# Patient Record
Sex: Female | Born: 1965 | Race: Black or African American | Hispanic: No | Marital: Single | Smoking: Former smoker
Health system: Southern US, Community
[De-identification: ages and names within clinical notes are randomized; demographics above are authoritative.]

## PROBLEM LIST (undated history)

## (undated) DIAGNOSIS — M1990 Unspecified osteoarthritis, unspecified site: Secondary | ICD-10-CM

## (undated) DIAGNOSIS — R480 Dyslexia and alexia: Secondary | ICD-10-CM

## (undated) DIAGNOSIS — I10 Essential (primary) hypertension: Secondary | ICD-10-CM

## (undated) DIAGNOSIS — M329 Systemic lupus erythematosus, unspecified: Secondary | ICD-10-CM

## (undated) DIAGNOSIS — N183 Chronic kidney disease, stage 3 unspecified: Secondary | ICD-10-CM

## (undated) DIAGNOSIS — IMO0002 Reserved for concepts with insufficient information to code with codable children: Secondary | ICD-10-CM

## (undated) HISTORY — DX: Dyslexia and alexia: R48.0

## (undated) HISTORY — PX: OOPHORECTOMY: SHX86

## (undated) HISTORY — DX: Chronic kidney disease, stage 3 unspecified: N18.30

---

## 2003-03-03 ENCOUNTER — Emergency Department (HOSPITAL_COMMUNITY): Admission: EM | Admit: 2003-03-03 | Discharge: 2003-03-03 | Payer: Self-pay | Admitting: Emergency Medicine

## 2003-03-24 ENCOUNTER — Inpatient Hospital Stay (HOSPITAL_COMMUNITY): Admission: AD | Admit: 2003-03-24 | Discharge: 2003-03-24 | Payer: Self-pay | Admitting: Family Medicine

## 2003-10-26 ENCOUNTER — Ambulatory Visit (HOSPITAL_COMMUNITY): Admission: RE | Admit: 2003-10-26 | Discharge: 2003-10-26 | Payer: Self-pay | Admitting: Unknown Physician Specialty

## 2004-03-05 ENCOUNTER — Emergency Department (HOSPITAL_COMMUNITY): Admission: EM | Admit: 2004-03-05 | Discharge: 2004-03-05 | Payer: Self-pay | Admitting: Emergency Medicine

## 2004-03-09 ENCOUNTER — Emergency Department (HOSPITAL_COMMUNITY): Admission: EM | Admit: 2004-03-09 | Discharge: 2004-03-10 | Payer: Self-pay | Admitting: Emergency Medicine

## 2004-12-12 ENCOUNTER — Emergency Department (HOSPITAL_COMMUNITY): Admission: EM | Admit: 2004-12-12 | Discharge: 2004-12-12 | Payer: Self-pay | Admitting: Family Medicine

## 2005-02-08 ENCOUNTER — Emergency Department (HOSPITAL_COMMUNITY): Admission: EM | Admit: 2005-02-08 | Discharge: 2005-02-09 | Payer: Self-pay | Admitting: Emergency Medicine

## 2005-05-28 ENCOUNTER — Emergency Department (HOSPITAL_COMMUNITY): Admission: EM | Admit: 2005-05-28 | Discharge: 2005-05-28 | Payer: Self-pay | Admitting: Emergency Medicine

## 2005-09-29 ENCOUNTER — Emergency Department (HOSPITAL_COMMUNITY): Admission: EM | Admit: 2005-09-29 | Discharge: 2005-09-29 | Payer: Self-pay | Admitting: Emergency Medicine

## 2005-10-22 ENCOUNTER — Emergency Department (HOSPITAL_COMMUNITY): Admission: EM | Admit: 2005-10-22 | Discharge: 2005-10-22 | Payer: Self-pay | Admitting: Emergency Medicine

## 2006-02-25 ENCOUNTER — Encounter: Admission: RE | Admit: 2006-02-25 | Discharge: 2006-02-25 | Payer: Self-pay | Admitting: Occupational Medicine

## 2006-03-26 ENCOUNTER — Other Ambulatory Visit: Admission: RE | Admit: 2006-03-26 | Discharge: 2006-03-26 | Payer: Self-pay | Admitting: Obstetrics and Gynecology

## 2006-04-06 ENCOUNTER — Ambulatory Visit (HOSPITAL_COMMUNITY): Admission: RE | Admit: 2006-04-06 | Discharge: 2006-04-06 | Payer: Self-pay | Admitting: Obstetrics and Gynecology

## 2007-12-08 ENCOUNTER — Inpatient Hospital Stay (HOSPITAL_COMMUNITY): Admission: AD | Admit: 2007-12-08 | Discharge: 2007-12-08 | Payer: Self-pay | Admitting: Obstetrics and Gynecology

## 2008-02-04 ENCOUNTER — Emergency Department (HOSPITAL_COMMUNITY): Admission: EM | Admit: 2008-02-04 | Discharge: 2008-02-04 | Payer: Self-pay | Admitting: Emergency Medicine

## 2008-02-24 ENCOUNTER — Emergency Department (HOSPITAL_COMMUNITY): Admission: EM | Admit: 2008-02-24 | Discharge: 2008-02-24 | Payer: Self-pay | Admitting: Emergency Medicine

## 2008-08-23 ENCOUNTER — Emergency Department (HOSPITAL_COMMUNITY): Admission: EM | Admit: 2008-08-23 | Discharge: 2008-08-23 | Payer: Self-pay | Admitting: Family Medicine

## 2009-02-09 ENCOUNTER — Emergency Department (HOSPITAL_COMMUNITY): Admission: EM | Admit: 2009-02-09 | Discharge: 2009-02-09 | Payer: Self-pay | Admitting: Family Medicine

## 2009-02-13 ENCOUNTER — Emergency Department (HOSPITAL_COMMUNITY): Admission: EM | Admit: 2009-02-13 | Discharge: 2009-02-13 | Payer: Self-pay | Admitting: Family Medicine

## 2009-04-02 ENCOUNTER — Emergency Department (HOSPITAL_COMMUNITY): Admission: EM | Admit: 2009-04-02 | Discharge: 2009-04-02 | Payer: Self-pay | Admitting: Emergency Medicine

## 2009-09-19 ENCOUNTER — Emergency Department (HOSPITAL_COMMUNITY): Admission: EM | Admit: 2009-09-19 | Discharge: 2009-09-19 | Payer: Self-pay | Admitting: Emergency Medicine

## 2010-01-05 ENCOUNTER — Emergency Department (HOSPITAL_COMMUNITY): Admission: EM | Admit: 2010-01-05 | Discharge: 2010-01-06 | Payer: Self-pay | Admitting: Emergency Medicine

## 2010-04-05 ENCOUNTER — Other Ambulatory Visit
Admission: RE | Admit: 2010-04-05 | Discharge: 2010-04-05 | Payer: Self-pay | Source: Home / Self Care | Admitting: Obstetrics and Gynecology

## 2010-04-05 ENCOUNTER — Encounter
Admission: RE | Admit: 2010-04-05 | Discharge: 2010-04-05 | Payer: Self-pay | Source: Home / Self Care | Attending: Internal Medicine | Admitting: Internal Medicine

## 2010-04-14 ENCOUNTER — Encounter: Payer: Self-pay | Admitting: Unknown Physician Specialty

## 2010-06-05 LAB — CBC
HCT: 40.8 % (ref 36.0–46.0)
Hemoglobin: 14.8 g/dL (ref 12.0–15.0)
MCH: 31.8 pg (ref 26.0–34.0)
MCHC: 36.3 g/dL — ABNORMAL HIGH (ref 30.0–36.0)
MCV: 87.6 fL (ref 78.0–100.0)
Platelets: 214 10*3/uL (ref 150–400)
RBC: 4.66 MIL/uL (ref 3.87–5.11)
RDW: 13.3 % (ref 11.5–15.5)
WBC: 8.8 10*3/uL (ref 4.0–10.5)

## 2010-06-05 LAB — POCT CARDIAC MARKERS
CKMB, poc: <1 ng/mL — ABNORMAL LOW (ref 1.0–8.0)
CKMB, poc: <1 ng/mL — ABNORMAL LOW (ref 1.0–8.0)
Myoglobin, poc: 50.6 ng/mL (ref 12–200)
Myoglobin, poc: 52.2 ng/mL (ref 12–200)
Troponin i, poc: <0.05 ng/mL (ref 0.00–0.09)
Troponin i, poc: <0.05 ng/mL (ref 0.00–0.09)

## 2010-06-05 LAB — DIFFERENTIAL
Basophils Absolute: 0 10*3/uL (ref 0.0–0.1)
Basophils Relative: 0 % (ref 0–1)
Eosinophils Absolute: 0.2 10*3/uL (ref 0.0–0.7)
Eosinophils Relative: 2 % (ref 0–5)
Lymphocytes Relative: 50 % — ABNORMAL HIGH (ref 12–46)
Lymphs Abs: 4.4 10*3/uL — ABNORMAL HIGH (ref 0.7–4.0)
Monocytes Absolute: 0.5 10*3/uL (ref 0.1–1.0)
Monocytes Relative: 6 % (ref 3–12)
Neutro Abs: 3.6 10*3/uL (ref 1.7–7.7)
Neutrophils Relative %: 41 % — ABNORMAL LOW (ref 43–77)

## 2010-06-05 LAB — POCT I-STAT, CHEM 8
BUN: 16 mg/dL (ref 6–23)
Calcium, Ion: 1.13 mmol/L (ref 1.12–1.32)
Chloride: 98 mEq/L (ref 96–112)
Creatinine, Ser: 0.9 mg/dL (ref 0.4–1.2)
Glucose, Bld: 374 mg/dL — ABNORMAL HIGH (ref 70–99)
HCT: 45 % (ref 36.0–46.0)
Hemoglobin: 15.3 g/dL — ABNORMAL HIGH (ref 12.0–15.0)
Potassium: 4 mEq/L (ref 3.5–5.1)
Sodium: 131 mEq/L — ABNORMAL LOW (ref 135–145)
TCO2: 25 mmol/L (ref 0–100)

## 2010-06-09 LAB — RAPID STREP SCREEN (MED CTR MEBANE ONLY): Streptococcus, Group A Screen (Direct): NEGATIVE

## 2010-07-01 LAB — GLUCOSE, CAPILLARY: Glucose-Capillary: 204 mg/dL — ABNORMAL HIGH (ref 70–99)

## 2010-12-11 ENCOUNTER — Emergency Department (HOSPITAL_COMMUNITY): Payer: 59

## 2010-12-11 ENCOUNTER — Emergency Department (HOSPITAL_COMMUNITY)
Admission: EM | Admit: 2010-12-11 | Discharge: 2010-12-11 | Disposition: A | Discharge status: Home / Self Care | Payer: 59 | Attending: Emergency Medicine | Admitting: Emergency Medicine

## 2010-12-11 DIAGNOSIS — J45909 Unspecified asthma, uncomplicated: Secondary | ICD-10-CM | POA: Insufficient documentation

## 2010-12-11 DIAGNOSIS — I10 Essential (primary) hypertension: Secondary | ICD-10-CM | POA: Insufficient documentation

## 2010-12-11 DIAGNOSIS — Z79899 Other long term (current) drug therapy: Secondary | ICD-10-CM | POA: Insufficient documentation

## 2010-12-11 DIAGNOSIS — E119 Type 2 diabetes mellitus without complications: Secondary | ICD-10-CM | POA: Insufficient documentation

## 2010-12-11 DIAGNOSIS — F17200 Nicotine dependence, unspecified, uncomplicated: Secondary | ICD-10-CM | POA: Insufficient documentation

## 2010-12-11 DIAGNOSIS — R0789 Other chest pain: Secondary | ICD-10-CM | POA: Insufficient documentation

## 2010-12-11 LAB — CBC
HCT: 38.9 % (ref 36.0–46.0)
Hemoglobin: 13.9 g/dL (ref 12.0–15.0)
MCH: 31.3 pg (ref 26.0–34.0)
MCHC: 35.7 g/dL (ref 30.0–36.0)
MCV: 87.6 fL (ref 78.0–100.0)
Platelets: 239 10*3/uL (ref 150–400)
RDW: 12.9 % (ref 11.5–15.5)
WBC: 9.3 10*3/uL (ref 4.0–10.5)

## 2010-12-11 LAB — BASIC METABOLIC PANEL
BUN: 19 mg/dL (ref 6–23)
CO2: 29 mEq/L (ref 19–32)
Calcium: 9.9 mg/dL (ref 8.4–10.5)
Creatinine, Ser: 1.22 mg/dL — ABNORMAL HIGH (ref 0.50–1.10)
GFR calc Af Amer: 57 mL/min — ABNORMAL LOW (ref >60)
GFR calc non Af Amer: 47 mL/min — ABNORMAL LOW (ref >60)
Glucose, Bld: 214 mg/dL — ABNORMAL HIGH (ref 70–99)
Potassium: 3.8 mEq/L (ref 3.5–5.1)

## 2010-12-11 LAB — POCT I-STAT TROPONIN I

## 2010-12-11 LAB — DIFFERENTIAL
Basophils Absolute: 0 10*3/uL (ref 0.0–0.1)
Eosinophils Absolute: 0.4 10*3/uL (ref 0.0–0.7)
Eosinophils Relative: 4 % (ref 0–5)
Lymphocytes Relative: 48 % — ABNORMAL HIGH (ref 12–46)
Lymphs Abs: 4.5 10*3/uL — ABNORMAL HIGH (ref 0.7–4.0)
Monocytes Absolute: 0.4 10*3/uL (ref 0.1–1.0)
Monocytes Relative: 5 % (ref 3–12)
Neutro Abs: 4.1 10*3/uL (ref 1.7–7.7)
Neutrophils Relative %: 44 % (ref 43–77)

## 2010-12-23 LAB — COMPREHENSIVE METABOLIC PANEL
AST: 23
BUN: 12
CO2: 26
Chloride: 101
Creatinine, Ser: 1.47 — ABNORMAL HIGH
GFR calc non Af Amer: 39 — ABNORMAL LOW
Glucose, Bld: 204 — ABNORMAL HIGH
Total Bilirubin: 0.2 — ABNORMAL LOW

## 2010-12-23 LAB — URINALYSIS, ROUTINE W REFLEX MICROSCOPIC
Glucose, UA: 250 — AB
Ketones, ur: NEGATIVE
Protein, ur: NEGATIVE

## 2010-12-23 LAB — CBC
HCT: 39.8
Hemoglobin: 13.7
MCV: 93.6
RBC: 4.25
WBC: 7.6

## 2010-12-23 LAB — WET PREP, GENITAL

## 2010-12-23 LAB — GLUCOSE, CAPILLARY: Glucose-Capillary: 181 — ABNORMAL HIGH

## 2011-01-20 ENCOUNTER — Emergency Department (HOSPITAL_COMMUNITY): Payer: 59

## 2011-01-20 ENCOUNTER — Emergency Department (HOSPITAL_COMMUNITY)
Admission: EM | Admit: 2011-01-20 | Discharge: 2011-01-20 | Disposition: A | Discharge status: Home / Self Care | Payer: 59 | Attending: Emergency Medicine | Admitting: Emergency Medicine

## 2011-01-20 DIAGNOSIS — R079 Chest pain, unspecified: Secondary | ICD-10-CM | POA: Insufficient documentation

## 2011-01-20 DIAGNOSIS — L299 Pruritus, unspecified: Secondary | ICD-10-CM | POA: Insufficient documentation

## 2011-01-20 DIAGNOSIS — IMO0001 Reserved for inherently not codable concepts without codable children: Secondary | ICD-10-CM | POA: Insufficient documentation

## 2011-01-20 DIAGNOSIS — J45909 Unspecified asthma, uncomplicated: Secondary | ICD-10-CM | POA: Insufficient documentation

## 2011-01-20 DIAGNOSIS — R21 Rash and other nonspecific skin eruption: Secondary | ICD-10-CM | POA: Insufficient documentation

## 2011-01-20 DIAGNOSIS — Z79899 Other long term (current) drug therapy: Secondary | ICD-10-CM | POA: Insufficient documentation

## 2011-01-20 DIAGNOSIS — L723 Sebaceous cyst: Secondary | ICD-10-CM | POA: Insufficient documentation

## 2011-01-20 DIAGNOSIS — Z7982 Long term (current) use of aspirin: Secondary | ICD-10-CM | POA: Insufficient documentation

## 2011-01-20 DIAGNOSIS — E119 Type 2 diabetes mellitus without complications: Secondary | ICD-10-CM | POA: Insufficient documentation

## 2011-01-20 DIAGNOSIS — F17200 Nicotine dependence, unspecified, uncomplicated: Secondary | ICD-10-CM | POA: Insufficient documentation

## 2011-01-20 DIAGNOSIS — I10 Essential (primary) hypertension: Secondary | ICD-10-CM | POA: Insufficient documentation

## 2011-01-20 DIAGNOSIS — M79609 Pain in unspecified limb: Secondary | ICD-10-CM | POA: Insufficient documentation

## 2011-01-20 DIAGNOSIS — R509 Fever, unspecified: Secondary | ICD-10-CM | POA: Insufficient documentation

## 2011-01-20 LAB — GLUCOSE, CAPILLARY: Glucose-Capillary: 145 mg/dL — ABNORMAL HIGH (ref 70–99)

## 2011-01-27 HISTORY — PX: FOOT SURGERY: SHX648

## 2011-02-27 ENCOUNTER — Emergency Department (HOSPITAL_COMMUNITY)
Admission: EM | Admit: 2011-02-27 | Discharge: 2011-02-27 | Disposition: A | Discharge status: Home / Self Care | Payer: 59 | Attending: Emergency Medicine | Admitting: Emergency Medicine

## 2011-02-27 ENCOUNTER — Encounter: Payer: Self-pay | Admitting: *Deleted

## 2011-02-27 DIAGNOSIS — B86 Scabies: Secondary | ICD-10-CM | POA: Insufficient documentation

## 2011-02-27 DIAGNOSIS — Z79899 Other long term (current) drug therapy: Secondary | ICD-10-CM | POA: Insufficient documentation

## 2011-02-27 DIAGNOSIS — L298 Other pruritus: Secondary | ICD-10-CM | POA: Insufficient documentation

## 2011-02-27 DIAGNOSIS — L2989 Other pruritus: Secondary | ICD-10-CM | POA: Insufficient documentation

## 2011-02-27 DIAGNOSIS — I10 Essential (primary) hypertension: Secondary | ICD-10-CM | POA: Insufficient documentation

## 2011-02-27 DIAGNOSIS — M129 Arthropathy, unspecified: Secondary | ICD-10-CM | POA: Insufficient documentation

## 2011-02-27 DIAGNOSIS — E119 Type 2 diabetes mellitus without complications: Secondary | ICD-10-CM | POA: Insufficient documentation

## 2011-02-27 DIAGNOSIS — R21 Rash and other nonspecific skin eruption: Secondary | ICD-10-CM | POA: Insufficient documentation

## 2011-02-27 DIAGNOSIS — M2550 Pain in unspecified joint: Secondary | ICD-10-CM | POA: Insufficient documentation

## 2011-02-27 DIAGNOSIS — Z7982 Long term (current) use of aspirin: Secondary | ICD-10-CM | POA: Insufficient documentation

## 2011-02-27 DIAGNOSIS — R269 Unspecified abnormalities of gait and mobility: Secondary | ICD-10-CM | POA: Insufficient documentation

## 2011-02-27 DIAGNOSIS — M7989 Other specified soft tissue disorders: Secondary | ICD-10-CM | POA: Insufficient documentation

## 2011-02-27 DIAGNOSIS — G8918 Other acute postprocedural pain: Secondary | ICD-10-CM | POA: Insufficient documentation

## 2011-02-27 HISTORY — DX: Essential (primary) hypertension: I10

## 2011-02-27 HISTORY — DX: Unspecified osteoarthritis, unspecified site: M19.90

## 2011-02-27 MED ORDER — HYDROCODONE-ACETAMINOPHEN 5-325 MG PO TABS: 1.0000 | ORAL_TABLET | Freq: Once | ORAL | Status: AC

## 2011-02-27 MED ORDER — PERMETHRIN 5 % EX CREA: TOPICAL_CREAM | CUTANEOUS | Status: AC

## 2011-02-27 MED ORDER — HYDROCODONE-ACETAMINOPHEN 5-325 MG PO TABS
1.0000 | ORAL_TABLET | Freq: Once | ORAL | Status: AC
  Administered 2011-02-27: 1 via ORAL
  Filled 2011-02-27: qty 1

## 2011-02-27 NOTE — ED Notes (Signed)
Pt reports she has had rash over entire body x 1 month. Small petechiae noted over entire body. Pt states she had been checked out for it before but it has not went away.

## 2011-02-27 NOTE — ED Notes (Signed)
Pt c/o mild nausea r/t pain medication.  Sprite given along with crackers.  Pt given rx and d/c instructions with verbal understanding expressed.

## 2011-02-27 NOTE — ED Provider Notes (Signed)
Medical screening examination/treatment/procedure(s) were performed by non-physician practitioner and as supervising physician I was immediately available for consultation/collaboration.   Benny Lennert, MD 02/27/11 2026

## 2011-02-27 NOTE — ED Provider Notes (Signed)
History     CSN: [id number] Arrival date & time: 02/27/2011  4:57 PM   First MD Initiated Contact with Patient 02/27/11 2003      Chief Complaint  Patient presents with  . 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  Patient is a 45 y.o. female presenting with rash. The history is provided by the patient. No language interpreter was used.  Rash  This is a chronic problem. The current episode started more than 1 week ago. The problem has been gradually worsening. The problem is associated with nothing. There has been no fever. The rash is present on the torso, back, abdomen, groin, left arm and right arm. The pain is at a severity of 4/10. The pain is moderate. The pain has been constant since onset. Associated symptoms include itching. She has tried antihistamines, antibiotic cream and steriods for the symptoms. The treatment provided no relief.    Past Medical History  Diagnosis Date  . Diabetes mellitus   . Hypertension   . Arthritis     History reviewed. No pertinent past surgical history.  History reviewed. No pertinent family history.  History  Substance Use Topics  . Smoking status: Current Everyday Smoker    Types: Cigarettes  . Smokeless tobacco: Not on file  . Alcohol Use: No    OB History    Grav Para Term Preterm Abortions TAB SAB Ect Mult Living                  Review of Systems  Constitutional: Negative for fever.  Musculoskeletal: Positive for arthralgias and gait problem.  Skin: Positive for itching and rash.  All other systems reviewed and are  negative.    Allergies  Penicillins  Home Medications   Current Outpatient Rx  Name Route Sig Dispense Refill  . VITAMIN C PO Oral Take 1 tablet by mouth daily.      . ASPIRIN EC 81 MG PO TBEC Oral Take 81 mg by mouth daily.      Marland Kitchen DIPHENHYDRAMINE HCL 25 MG PO CAPS Oral Take 25 mg by mouth at bedtime as needed. For rash      . OMEGA-3 FATTY ACIDS 1000 MG PO CAPS Oral Take 1 g by mouth daily.      Marland Kitchen GLIPIZIDE 10 MG PO TABS Oral Take 10 mg by mouth daily.      Marland Kitchen HYDROCORTISONE 1 % EX CREA Topical Apply 1 application topically 2 (two) times daily.      . IBUPROFEN 800 MG PO TABS Oral Take 800 mg by mouth 2 (two) times daily as needed. For pain     . LISINOPRIL 20 MG PO TABS Oral Take 20 mg by mouth daily.      Marland Kitchen METFORMIN HCL 1000 MG PO TABS Oral Take 1,000 mg by mouth 2 (two) times daily with a meal.      . NYQUIL PO Oral Take 30 mLs by mouth at bedtime as needed. For sleep     . SIMVASTATIN 10 MG PO TABS Oral Take 10 mg by mouth at bedtime.        BP 123/61  Pulse 73  Temp(Src) 97.7 F (36.5 C) (Oral)  Resp 20  SpO2 99%  Physical Exam  Nursing note and vitals reviewed. Constitutional: She is oriented to person, place, and time. She appears well-developed and well-nourished. No distress.  HENT:  Head: Normocephalic and atraumatic.  Right Ear: External ear normal.  Left Ear: External ear normal.  Eyes: Conjunctivae are normal. Pupils are equal, round, and reactive to light. No scleral icterus.  Neck: Normal range of motion. Neck supple.  Cardiovascular: Normal rate and normal heart sounds.   Pulmonary/Chest: Effort normal and breath sounds normal. She exhibits no tenderness.  Abdominal: Soft. Bowel sounds are normal.  Musculoskeletal:       Patient with dressing and post op shoe noted to right foot - mild edema noted, sutures removed, wound clean and healing - no drainage.  Neurological: She is alert and oriented to person, place, and time.  Skin: Rash noted.        Diffuse papular rash noted to trunk, arm, abdomen, back - several of the papules form a line and burrowing noted.  Psychiatric: She has a normal mood and affect. Her behavior is normal. Judgment and thought content normal.    ED Course  Procedures (including critical care time)  Labs Reviewed - No data to display No results found.   Post op foot pain scabies    MDM  Will refill pain medication, I believe this to be scabies.  Will treat as such        Scarlette Calico C. Dunlap, [place] 02/27/11 2014

## 2011-02-27 NOTE — ED Notes (Signed)
ambulatory at discharge.

## 2011-04-11 ENCOUNTER — Other Ambulatory Visit: Payer: Self-pay | Admitting: Internal Medicine

## 2011-04-11 DIAGNOSIS — Z1231 Encounter for screening mammogram for malignant neoplasm of breast: Secondary | ICD-10-CM

## 2011-04-25 ENCOUNTER — Ambulatory Visit
Admission: RE | Admit: 2011-04-25 | Discharge: 2011-04-25 | Disposition: A | Discharge status: Home / Self Care | Payer: 59 | Source: Ambulatory Visit | Attending: Internal Medicine | Admitting: Internal Medicine

## 2011-04-25 DIAGNOSIS — Z1231 Encounter for screening mammogram for malignant neoplasm of breast: Secondary | ICD-10-CM

## 2011-05-20 ENCOUNTER — Other Ambulatory Visit: Payer: Self-pay | Admitting: Obstetrics and Gynecology

## 2011-05-20 ENCOUNTER — Other Ambulatory Visit (HOSPITAL_COMMUNITY)
Admission: RE | Admit: 2011-05-20 | Discharge: 2011-05-20 | Disposition: A | Discharge status: Home / Self Care | Payer: 59 | Source: Ambulatory Visit | Attending: Obstetrics and Gynecology | Admitting: Obstetrics and Gynecology

## 2011-05-20 DIAGNOSIS — Z01419 Encounter for gynecological examination (general) (routine) without abnormal findings: Secondary | ICD-10-CM | POA: Insufficient documentation

## 2011-05-21 ENCOUNTER — Emergency Department (INDEPENDENT_AMBULATORY_CARE_PROVIDER_SITE_OTHER)
Admission: EM | Admit: 2011-05-21 | Discharge: 2011-05-21 | Disposition: A | Discharge status: Home Health | Payer: Worker's Compensation | Source: Home / Self Care

## 2011-05-21 ENCOUNTER — Encounter (HOSPITAL_COMMUNITY): Payer: Self-pay | Admitting: Cardiology

## 2011-05-21 DIAGNOSIS — T148XXA Other injury of unspecified body region, initial encounter: Secondary | ICD-10-CM

## 2011-05-21 MED ORDER — CYCLOBENZAPRINE HCL 5 MG PO TABS: 5.0000 mg | ORAL_TABLET | Freq: Three times a day (TID) | ORAL | Status: AC | PRN

## 2011-05-21 MED ORDER — KETOROLAC TROMETHAMINE 30 MG/ML IJ SOLN
INTRAMUSCULAR | Status: AC
  Filled 2011-05-21: qty 1

## 2011-05-21 MED ORDER — IBUPROFEN 800 MG PO TABS: 800.0000 mg | ORAL_TABLET | Freq: Three times a day (TID) | ORAL | Status: AC | PRN

## 2011-05-21 MED ORDER — IBUPROFEN 800 MG PO TABS
800.0000 mg | ORAL_TABLET | Freq: Once | ORAL | Status: AC
  Administered 2011-05-21: 800 mg via ORAL

## 2011-05-21 MED ORDER — IBUPROFEN 800 MG PO TABS
ORAL_TABLET | ORAL | Status: AC
  Filled 2011-05-21: qty 1

## 2011-05-21 MED ORDER — IBUPROFEN 800 MG PO TABS: 800.0000 mg | ORAL_TABLET | Freq: Once | ORAL | Status: DC

## 2011-05-21 MED ORDER — KETOROLAC TROMETHAMINE 30 MG/ML IJ SOLN: 30.0000 mg | Freq: Once | INTRAMUSCULAR | Status: DC

## 2011-05-21 NOTE — ED Provider Notes (Signed)
Courtney Payne is a 46 y.o. female who presents to Urgent Care today for acute shoulder and neck pain. patient states she was at work. 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 The patient experienced immediate, sharp pain in left shoulder and left trapezius muscle. Pain when she tries to abduct her arm or raise her hand above head. She does not have history of shoulder injury in the past. She denies any chest pain or shortness of breath during this period.   PMH reviewed.  ROS as above otherwise neg Medications reviewed. No current facility-administered medications for this encounter.   Current Outpatient Prescriptions  Medication Sig Dispense Refill  . Ascorbic Acid (VITAMIN C PO) Take 1 tablet by mouth daily.        Marland Kitchen aspirin EC 81 MG tablet Take 81 mg by mouth daily.        . diphenhydrAMINE (BENADRYL) 25 mg capsule Take 25 mg by mouth at bedtime as needed. For rash        . fish oil-omega-3 fatty acids 1000 MG capsule Take 1 g by mouth daily.        Marland Kitchen glipiZIDE (GLUCOTROL) 10 MG tablet Take 10 mg by mouth daily.        . hydrocortisone cream 1 % Apply 1 application topically 2 (two) times daily.        Marland Kitchen ibuprofen (ADVIL,MOTRIN) 800 MG tablet Take 800 mg by mouth 2 (two) times daily as needed. For pain       . lisinopril (PRINIVIL,ZESTRIL) 20 MG tablet Take 20 mg by mouth daily.        . metFORMIN (GLUCOPHAGE) 1000 MG tablet Take 1,000 mg by mouth 2 (two) times daily with a meal.        . Pseudoeph-Doxylamine-DM-APAP (NYQUIL PO) Take 30 mLs by mouth at bedtime as needed. For sleep       . simvastatin (ZOCOR) 10 MG tablet Take 10 mg by mouth at bedtime.          Exam:  BP 136/81  Pulse 80  Temp(Src) 98.6 F (37 C) (Oral)  Resp 20  SpO2 98% Gen: Well NAD HEENT: EOMI,  MMM Lungs: CTABL Nl WOB Heart: RRR no MRG Abd: NABS, NT, ND Exts: Non edematous BL  LE, warm and well perfused.    Musculoskeletal: Tender to palpation along the left trapezius muscle with multiple trigger points. Not tender to palpation along right trapezius muscle. Also, some tenderness along the left lateral aspect of pectoralis major muscles on left side. Some tenderness of left axilla as well. I do not note any step-off or any indication of rib injury. Some pain with internal and external rotation, as well.  Neuro: Intact sensation and motor function throughout left upper extremity. Good pulses bilaterally  Assessment and Plan: #1. Muscle strain: I believe patient is muscle strain of her Trellis major muscle. A day. She also has acute muscle spasm, possibly related to the above injury of her trapezius muscle. Plan to treat with 800 mg ibuprofen. Also, plan to treat with muscle relaxer. Patient did state she was in some pain and would like some pain medication before she leaves, therefore, provided her with 30 mg Toradol in clinic. Gave warnings about not driving with muscle relaxer.   Renold Don, MD 05/21/11 2037

## 2011-05-21 NOTE — ED Notes (Signed)
Pt declined toradol injection requesting motrin prior to D/C due to bus ride.
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
## 2011-05-23 NOTE — ED Provider Notes (Signed)
Medical screening examination/treatment/procedure(s) were performed by resident physician or non-physician practitioner and as supervising physician I was immediately available for consultation/collaboration.   Barkley Bruns MD.    Barkley Bruns, MD 05/23/11 [phone]

## 2011-10-29 ENCOUNTER — Encounter (HOSPITAL_COMMUNITY): Payer: Self-pay | Admitting: Emergency Medicine

## 2011-10-29 ENCOUNTER — Emergency Department (HOSPITAL_COMMUNITY)
Admission: EM | Admit: 2011-10-29 | Discharge: 2011-10-29 | Disposition: A | Discharge status: Home / Self Care | Payer: 59 | Attending: Emergency Medicine | Admitting: Emergency Medicine

## 2011-10-29 DIAGNOSIS — F17200 Nicotine dependence, unspecified, uncomplicated: Secondary | ICD-10-CM | POA: Insufficient documentation

## 2011-10-29 DIAGNOSIS — S39012A Strain of muscle, fascia and tendon of lower back, initial encounter: Secondary | ICD-10-CM

## 2011-10-29 DIAGNOSIS — J45909 Unspecified asthma, uncomplicated: Secondary | ICD-10-CM | POA: Insufficient documentation

## 2011-10-29 DIAGNOSIS — X500XXA Overexertion from strenuous movement or load, initial encounter: Secondary | ICD-10-CM | POA: Insufficient documentation

## 2011-10-29 DIAGNOSIS — Z880 Allergy status to penicillin: Secondary | ICD-10-CM | POA: Insufficient documentation

## 2011-10-29 DIAGNOSIS — I10 Essential (primary) hypertension: Secondary | ICD-10-CM | POA: Insufficient documentation

## 2011-10-29 DIAGNOSIS — S335XXA Sprain of ligaments of lumbar spine, initial encounter: Secondary | ICD-10-CM | POA: Insufficient documentation

## 2011-10-29 DIAGNOSIS — E119 Type 2 diabetes mellitus without complications: Secondary | ICD-10-CM | POA: Insufficient documentation

## 2011-10-29 MED ORDER — NAPROXEN 500 MG PO TABS: 500.0000 mg | ORAL_TABLET | Freq: Two times a day (BID) | ORAL | Status: DC

## 2011-10-29 MED ORDER — CYCLOBENZAPRINE HCL 10 MG PO TABS
10.0000 mg | ORAL_TABLET | Freq: Once | ORAL | Status: AC
  Administered 2011-10-29: 10 mg via ORAL
  Filled 2011-10-29: qty 1

## 2011-10-29 MED ORDER — ORPHENADRINE CITRATE ER 100 MG PO TB12: 100.0000 mg | ORAL_TABLET | Freq: Two times a day (BID) | ORAL | Status: DC

## 2011-10-29 MED ORDER — OXYCODONE-ACETAMINOPHEN 5-325 MG PO TABS: 1.0000 | ORAL_TABLET | ORAL | Status: DC | PRN

## 2011-10-29 MED ORDER — IBUPROFEN 400 MG PO TABS
800.0000 mg | ORAL_TABLET | Freq: Once | ORAL | Status: AC
  Administered 2011-10-29: 800 mg via ORAL
  Filled 2011-10-29: qty 2

## 2011-10-29 MED ORDER — OXYCODONE-ACETAMINOPHEN 5-325 MG PO TABS
1.0000 | ORAL_TABLET | Freq: Once | ORAL | Status: AC
  Administered 2011-10-29: 1 via ORAL
  Filled 2011-10-29: qty 1

## 2011-10-29 NOTE — ED Notes (Signed)
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

## 2011-10-29 NOTE — ED Provider Notes (Signed)
History  This chart was scribed for Dione Booze, MD by Shari Heritage. The patient was seen in room TR08C/TR08C. Patient's care was started at 1813.   CSN: [id number]  Arrival date & time 10/29/11  1813   None     Chief Complaint  Patient presents with  . Back Pain    The history is provided by the patient. No language interpreter was used.   Courtney Payne is a 46 y.o. female who presents to the Emergency Department complaining of sharp, severe, left-sided lower back pain onset 2-3 hours ago. 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 Patient rates pain as 8/10 right now. She says that at worst pain is 10/10. Patient denies any weakness, numbness or tingling. No urinary or bowel incontinence. Patient has a medical history of diabetes, HTN, arthritis and asthma. She is a current everyday smoker (less than 1 pack/day).  PCP - Polite   Past Medical History  Diagnosis Date  . Diabetes mellitus   . Hypertension   . Arthritis   . Asthma     Past Surgical History  Procedure Date  . Foot surgery 01/27/11    right foot for ganglion cyst    History reviewed. No pertinent family history.  History  Substance Use Topics  . Smoking status: Current Everyday Smoker -- 0.5 packs/day    Types: Cigarettes  . Smokeless tobacco: Not on file  . Alcohol Use: No    OB History    Grav Para Term Preterm Abortions TAB SAB Ect Mult Living                  Review of Systems  Musculoskeletal: Positive for back pain.  All other systems reviewed and are negative.   Allergies  Penicillins  Home Medications   Current Outpatient Rx  Name Route Sig Dispense Refill  . ALBUTEROL SULFATE HFA 108 (90 BASE) MCG/ACT IN AERS Inhalation Inhale 2 puffs into the lungs every 6 (six) hours as needed. For shortness of breath    . VITAMIN C PO Oral Take 1 tablet by mouth daily.      . ASPIRIN EC 81 MG PO TBEC Oral  Take 81 mg by mouth daily.      Marland Kitchen CALCIUM PO Oral Take 1 tablet by mouth daily.    Marland Kitchen DIPHENHYDRAMINE HCL 25 MG PO CAPS Oral Take 25 mg by mouth at bedtime as needed. For rash      . OMEGA-3 FATTY ACIDS 1000 MG PO CAPS Oral Take 1 g by mouth daily.      Marland Kitchen GLIPIZIDE 10 MG PO TABS Oral Take 10 mg by mouth daily.      . IBUPROFEN 800 MG PO TABS Oral Take 800 mg by mouth 2 (two) times daily as needed. For pain     . METFORMIN HCL 1000 MG PO TABS Oral Take 1,000 mg by mouth 2 (two) times daily with a meal.      . OLMESARTAN MEDOXOMIL-HCTZ 40-25 MG PO TABS Oral Take 1 tablet by mouth daily.    Marland Kitchen SIMVASTATIN 10 MG PO TABS Oral Take 10 mg by mouth at bedtime.        BP 122/77  Pulse 94  Temp 98.2 F (36.8 C) (Oral)  Resp 16  SpO2 98%  Physical Exam  Constitutional: She is oriented to person, place, and time. She appears well-developed and well-nourished.  Appears uncomfortable.  HENT:  Head: Normocephalic and atraumatic.  Musculoskeletal:       Lumbar back: She exhibits tenderness and spasm.       Tenderness of lumbar spine. Bilateral paralumbar spasm that is worse on the left than on the right.  Neurological: She is alert and oriented to person, place, and time.  Psychiatric: She has a normal mood and affect. Her behavior is normal.    ED Course  Procedures (including critical care time) DIAGNOSTIC STUDIES: Oxygen Saturation is 98% on room air, normal by my interpretation.    COORDINATION OF CARE: 7:42pm- Patient informed of current plan for treatment and evaluation and agrees with plan at this time. Will admininster 1 tablet of Percocet 5-325 mg, 1 tablet of Ibuprofen 800 mg and 1 tablet of Flexeril 10 mg in the ED. Will discharge patient home with prescriptions for Norflex 100 mg, Roxicet 5-325 mg and Naprosyn 500 mg. Recommend that patient apply ice to her back at home.    1. Lumbar strain       MDM  Acute lumbar strain. No indication for x-rays. She'll be treated with  naproxen, orphenadrine, and Percocet.   I personally performed the services described in this documentation, which was scribed in my presence. The recorded information has been reviewed and considered.  Dione Booze, MD 10/31/11 1455

## 2011-11-07 ENCOUNTER — Encounter (HOSPITAL_COMMUNITY): Payer: Self-pay | Admitting: Radiology

## 2011-11-07 ENCOUNTER — Emergency Department (HOSPITAL_COMMUNITY): Payer: 59

## 2011-11-07 ENCOUNTER — Emergency Department (HOSPITAL_COMMUNITY)
Admission: EM | Admit: 2011-11-07 | Discharge: 2011-11-07 | Disposition: A | Discharge status: Home / Self Care | Payer: 59 | Attending: Emergency Medicine | Admitting: Emergency Medicine

## 2011-11-07 DIAGNOSIS — I10 Essential (primary) hypertension: Secondary | ICD-10-CM | POA: Insufficient documentation

## 2011-11-07 DIAGNOSIS — M129 Arthropathy, unspecified: Secondary | ICD-10-CM | POA: Insufficient documentation

## 2011-11-07 DIAGNOSIS — M549 Dorsalgia, unspecified: Secondary | ICD-10-CM | POA: Insufficient documentation

## 2011-11-07 DIAGNOSIS — E119 Type 2 diabetes mellitus without complications: Secondary | ICD-10-CM | POA: Insufficient documentation

## 2011-11-07 DIAGNOSIS — F17200 Nicotine dependence, unspecified, uncomplicated: Secondary | ICD-10-CM | POA: Diagnosis not present

## 2011-11-07 DIAGNOSIS — M25519 Pain in unspecified shoulder: Secondary | ICD-10-CM | POA: Diagnosis not present

## 2011-11-07 DIAGNOSIS — Z043 Encounter for examination and observation following other accident: Secondary | ICD-10-CM | POA: Diagnosis not present

## 2011-11-07 LAB — GLUCOSE, CAPILLARY

## 2011-11-07 MED ORDER — HYDROCODONE-ACETAMINOPHEN 5-325 MG PO TABS
1.0000 | ORAL_TABLET | Freq: Once | ORAL | Status: AC
  Administered 2011-11-07: 1 via ORAL
  Filled 2011-11-07: qty 1

## 2011-11-07 MED ORDER — IBUPROFEN 800 MG PO TABS
800.0000 mg | ORAL_TABLET | Freq: Once | ORAL | Status: AC
  Administered 2011-11-07: 800 mg via ORAL
  Filled 2011-11-07: qty 1

## 2011-11-07 MED ORDER — NAPROXEN 500 MG PO TABS: 500.0000 mg | ORAL_TABLET | Freq: Two times a day (BID) | ORAL | Status: AC
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
## 2011-11-07 NOTE — ED Provider Notes (Signed)
Medical screening examination/treatment/procedure(s) were performed by non-physician practitioner and as supervising physician I was immediately available for consultation/collaboration.   Kalab Camps, MD 11/07/11 1522 

## 2011-11-07 NOTE — ED Notes (Signed)
PA at bedside.

## 2011-11-07 NOTE — ED Notes (Signed)
Pt  Taken off backboard at this time
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
## 2012-01-16 ENCOUNTER — Encounter (HOSPITAL_COMMUNITY): Payer: Self-pay | Admitting: *Deleted

## 2012-01-16 ENCOUNTER — Emergency Department (INDEPENDENT_AMBULATORY_CARE_PROVIDER_SITE_OTHER): Payer: Worker's Compensation

## 2012-01-16 ENCOUNTER — Emergency Department (INDEPENDENT_AMBULATORY_CARE_PROVIDER_SITE_OTHER)
Admission: EM | Admit: 2012-01-16 | Discharge: 2012-01-16 | Disposition: A | Discharge status: Home / Self Care | Payer: 59 | Source: Home / Self Care | Attending: Emergency Medicine | Admitting: Emergency Medicine

## 2012-01-16 DIAGNOSIS — J45909 Unspecified asthma, uncomplicated: Secondary | ICD-10-CM

## 2012-01-16 DIAGNOSIS — J069 Acute upper respiratory infection, unspecified: Secondary | ICD-10-CM

## 2012-01-16 DIAGNOSIS — J209 Acute bronchitis, unspecified: Secondary | ICD-10-CM

## 2012-01-16 DIAGNOSIS — H6691 Otitis media, unspecified, right ear: Secondary | ICD-10-CM

## 2012-01-16 DIAGNOSIS — J019 Acute sinusitis, unspecified: Secondary | ICD-10-CM

## 2012-01-16 DIAGNOSIS — Z720 Tobacco use: Secondary | ICD-10-CM

## 2012-01-16 MED ORDER — ALBUTEROL SULFATE HFA 108 (90 BASE) MCG/ACT IN AERS: 1.0000 | INHALATION_SPRAY | Freq: Four times a day (QID) | RESPIRATORY_TRACT | Status: DC | PRN

## 2012-01-16 MED ORDER — ALBUTEROL SULFATE (5 MG/ML) 0.5% IN NEBU
INHALATION_SOLUTION | RESPIRATORY_TRACT | Status: AC
  Filled 2012-01-16: qty 1

## 2012-01-16 MED ORDER — BENZONATATE 200 MG PO CAPS: 200.0000 mg | ORAL_CAPSULE | Freq: Three times a day (TID) | ORAL | Status: DC | PRN

## 2012-01-16 MED ORDER — ACETAMINOPHEN 325 MG PO TABS
ORAL_TABLET | ORAL | Status: AC
  Filled 2012-01-16: qty 2

## 2012-01-16 MED ORDER — IPRATROPIUM BROMIDE 0.02 % IN SOLN
0.5000 mg | Freq: Once | RESPIRATORY_TRACT | Status: AC
  Administered 2012-01-16: 0.5 mg via RESPIRATORY_TRACT

## 2012-01-16 MED ORDER — PREDNISONE 5 MG PO KIT: 1.0000 | PACK | Freq: Every day | ORAL | Status: DC

## 2012-01-16 MED ORDER — ALBUTEROL SULFATE (5 MG/ML) 0.5% IN NEBU
5.0000 mg | INHALATION_SOLUTION | Freq: Once | RESPIRATORY_TRACT | Status: AC
  Administered 2012-01-16: 5 mg via RESPIRATORY_TRACT

## 2012-01-16 MED ORDER — HYDROCODONE-ACETAMINOPHEN 5-325 MG PO TABS: ORAL_TABLET | ORAL | Status: DC

## 2012-01-16 MED ORDER — AZITHROMYCIN 250 MG PO TABS: ORAL_TABLET | ORAL | Status: DC

## 2012-01-16 MED ORDER — ACETAMINOPHEN 325 MG PO TABS
650.0000 mg | ORAL_TABLET | Freq: Once | ORAL | Status: AC
  Administered 2012-01-16: 650 mg via ORAL

## 2012-01-16 NOTE — ED Provider Notes (Signed)
Chief Complaint  Patient presents with  . Cough    History of Present Illness:   The patient is a 46 year old female with insulin requiring diabetes, hypertension, and chronic foot pain who presents today with a three-day history of scratchy throat, headache, lightheadedness, fever of up to 101 at home, chills, and sweats. She also has been aching all over, had nasal congestion with yellow bloody drainage, sinus pressure, redness of the eyes, and cough productive yellow-green sputum. She's had some wheezing and chest tightness. She has a history of asthma and has taken Ventolin for this in the past but does not have an inhaler right now. She is using Symbicort regularly. She is smoking about a pack of cigarettes a day. She's also had some nausea and vomiting. She has a history of diabetes. Most recent blood sugar was 139. She takes insulin and metformin. She has hypertension and hyperlipidemia. She takes medication for this as well and has chronic foot pain for which she takes Norco. She is allergic to penicillin.  Review of Systems:  Other than noted above, the patient denies any of the following symptoms. Systemic:  No fever, chills, sweats, fatigue, myalgias, headache, or anorexia. Eye:  No redness, pain or drainage. ENT:  No earache, ear congestion, nasal congestion, sneezing, rhinorrhea, sinus pressure, sinus pain, post nasal drip, or sore throat. Lungs:  No cough, sputum production, wheezing, shortness of breath, or chest pain. GI:  No abdominal pain, nausea, vomiting, or diarrhea.  PMFSH:  Past medical history, family history, social history, meds, and allergies were reviewed.  Physical Exam:   Vital signs:  Pulse 72  Temp 99 F (37.2 C) (Oral)  Resp 16  SpO2 98%  LMP 12/26/2011 General:  Alert, in no distress. Eye:  No conjunctival injection or drainage. Lids were normal. ENT:  Her right TM was pink but not bulging, left TM was normal.  Nasal mucosa was clear and uncongested, without  drainage.  Mucous membranes were moist.  Pharynx was clear, without exudate or drainage.  There were no oral ulcerations or lesions. Neck:  Supple, no adenopathy, tenderness or mass. Lungs:  No respiratory distress.  Lungs showed bilateral expiratory wheezes without rales or rhonchi.  Radiology:  Dg Chest 2 View  01/16/2012  *RADIOLOGY REPORT*  Clinical Data: Headache.  Cough.  Chest congestion  CHEST - 2 VIEW  Comparison: 10/16/2011 and previous  Findings: Heart size is normal.  Mediastinal shadows are normal. There is bronchial thickening but no infiltrate, collapse or effusion.  No significant bony finding.  IMPRESSION: Bronchial thickening.  No consolidation or collapse.   Original Report Authenticated By: Thomasenia Sales, M.D.    I reviewed the images independently and personally and concur with the radiologist's findings.  Course in Urgent Care Center:   The patient was given a DuoNeb nebulizer treatment and felt a lot better thereafter. Her lungs sounded better but were not completely wheeze free, but the patient felt that she was improved enough to go home.  Assessment:  The primary encounter diagnosis was Viral upper respiratory infection. Diagnoses of Acute bronchitis, Acute sinusitis, Asthma, Tobacco use, and Right otitis media were also pertinent to this visit.  Plan:   1.  The following meds were prescribed:   New Prescriptions   ALBUTEROL (PROVENTIL HFA;VENTOLIN HFA) 108 (90 BASE) MCG/ACT INHALER    Inhale 1-2 puffs into the lungs every 6 (six) hours as needed for wheezing.   AZITHROMYCIN (ZITHROMAX Z-PAK) 250 MG TABLET    Take  as directed.   BENZONATATE (TESSALON) 200 MG CAPSULE    Take 1 capsule (200 mg total) by mouth 3 (three) times daily as needed for cough.   HYDROCODONE-ACETAMINOPHEN (NORCO/VICODIN) 5-325 MG PER TABLET    1 to 2 tabs every 4 to 6 hours as needed for pain.   PREDNISONE 5 MG KIT    Take 1 kit (5 mg total) by mouth daily after breakfast. Prednisone 5 mg 6 day  dosepack.  Take as directed.   2.  The patient was instructed in symptomatic care and handouts were given. 3.  The patient was told to return if becoming worse in any way, if no better in 3 or 4 days, and given some red flag symptoms that would indicate earlier return.   Reuben Likes, MD 01/16/12 2113

## 2012-01-16 NOTE — ED Notes (Signed)
Pt  Reports   Symptoms  Of  Cough  /  Congestion       Fever       X  sev  Days               With  Associated  Nasal  stuffyness        And  Episodes  Of    Fever          Yesterday           -  At  tis time  He  Is  Awake  As  Well  As  Alert  Speaking in  Complete  sentances   Her  Skin is  Warm    And  Dry       Cap  Refill is  Brisk           Symptoms  Not  releived  By otc  meds     -      Pt  Reports  She is  A  Smoker

## 2012-03-12 ENCOUNTER — Ambulatory Visit: Payer: Self-pay

## 2012-03-12 ENCOUNTER — Other Ambulatory Visit: Payer: Self-pay | Admitting: Occupational Medicine

## 2012-03-12 DIAGNOSIS — M25539 Pain in unspecified wrist: Secondary | ICD-10-CM

## 2012-05-24 ENCOUNTER — Other Ambulatory Visit: Payer: Self-pay | Admitting: Obstetrics and Gynecology

## 2012-05-24 ENCOUNTER — Other Ambulatory Visit (HOSPITAL_COMMUNITY)
Admission: RE | Admit: 2012-05-24 | Discharge: 2012-05-24 | Disposition: A | Discharge status: Home / Self Care | Payer: 59 | Source: Ambulatory Visit | Attending: Obstetrics and Gynecology | Admitting: Obstetrics and Gynecology

## 2012-05-24 DIAGNOSIS — Z01419 Encounter for gynecological examination (general) (routine) without abnormal findings: Secondary | ICD-10-CM | POA: Insufficient documentation

## 2012-05-24 DIAGNOSIS — Z1151 Encounter for screening for human papillomavirus (HPV): Secondary | ICD-10-CM | POA: Insufficient documentation

## 2012-05-26 ENCOUNTER — Other Ambulatory Visit: Payer: Self-pay

## 2012-05-26 DIAGNOSIS — Z1231 Encounter for screening mammogram for malignant neoplasm of breast: Secondary | ICD-10-CM

## 2012-06-04 ENCOUNTER — Ambulatory Visit
Admission: RE | Admit: 2012-06-04 | Discharge: 2012-06-04 | Disposition: A | Discharge status: Home / Self Care | Payer: 59 | Source: Ambulatory Visit

## 2012-11-05 IMAGING — CR DG CHEST 2V
2 series · 2 of 2 positions shown · non-contrast
Comparison: 01/06/2010

CLINICAL DATA: chest pain

CHEST - 2 VIEW

[w chest pa]
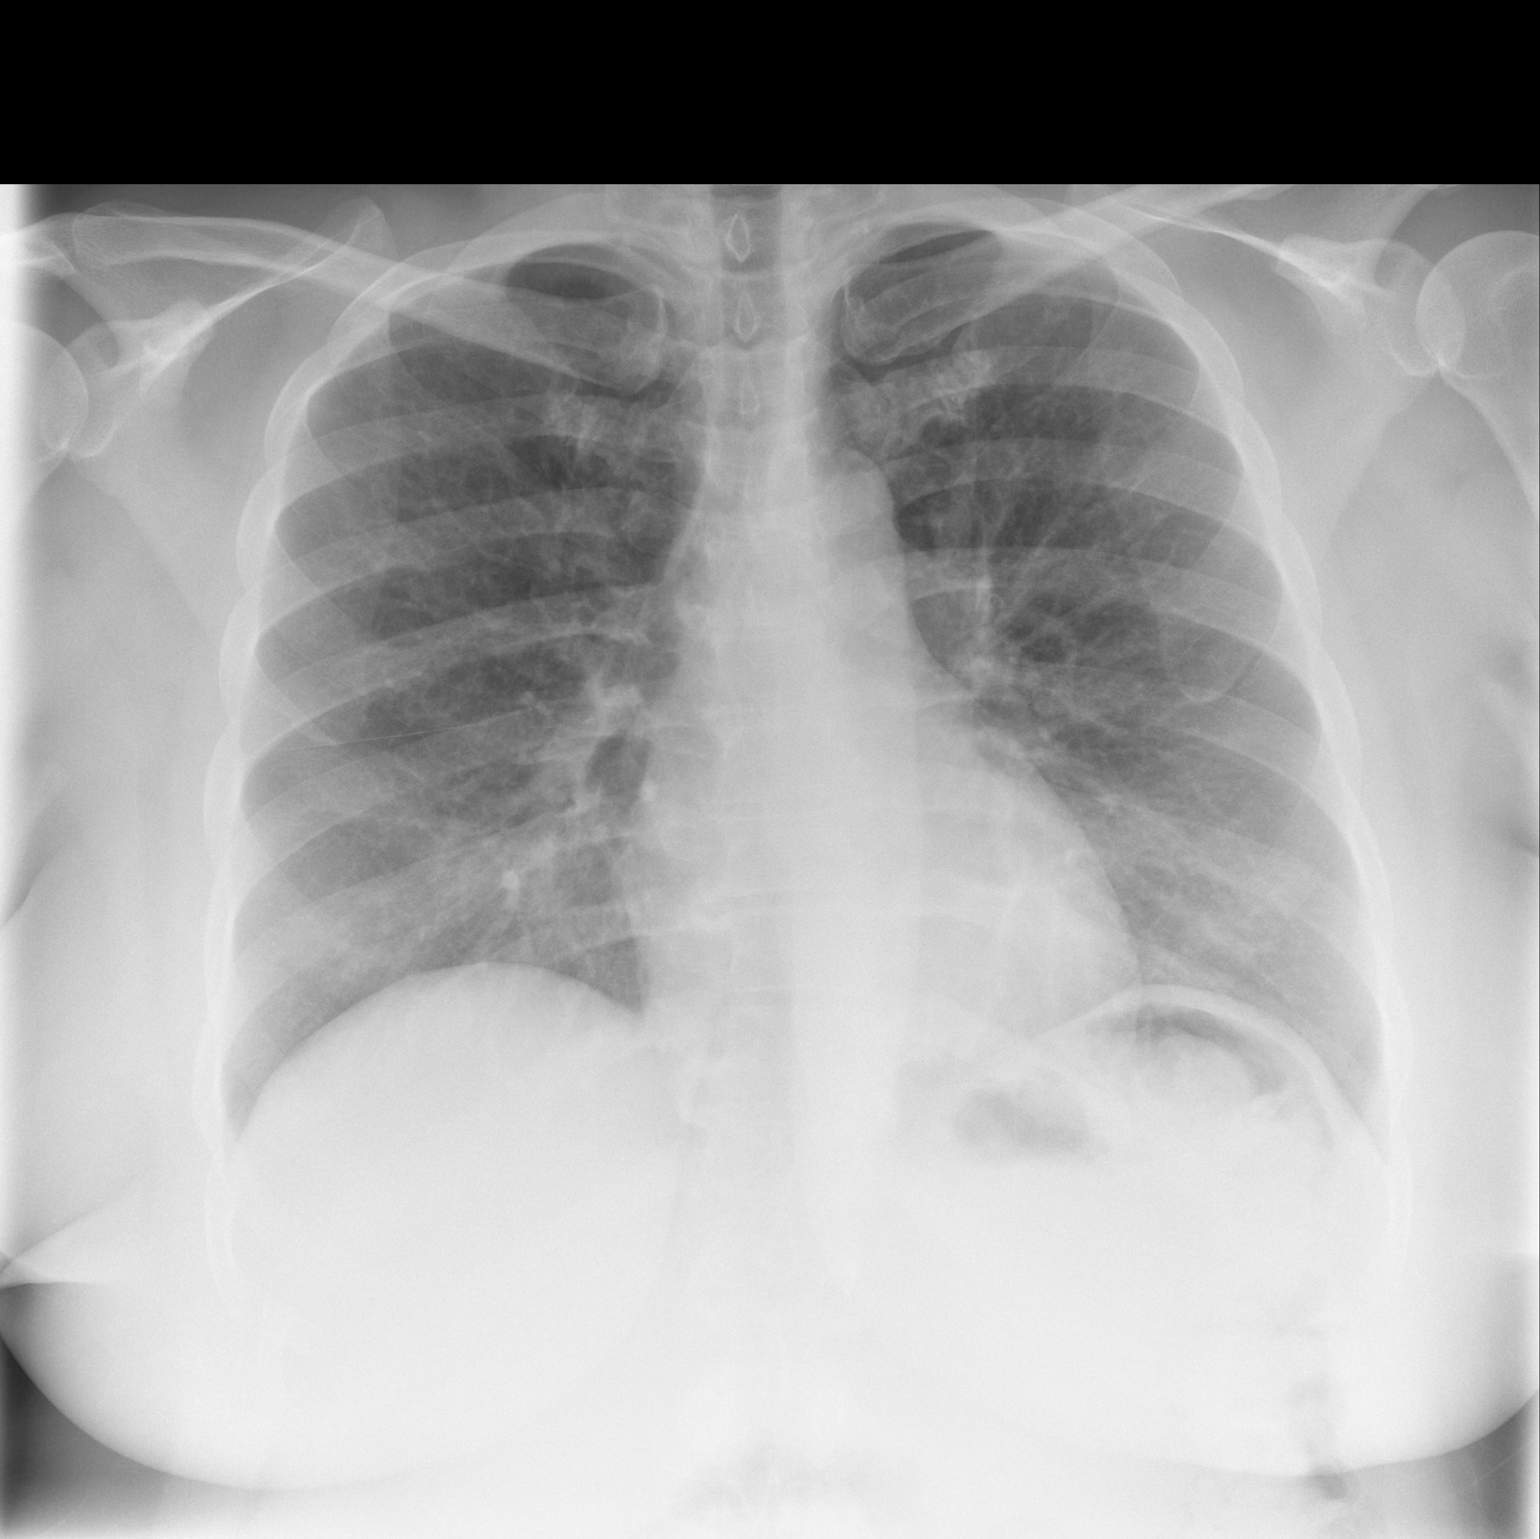

[w chest lat]
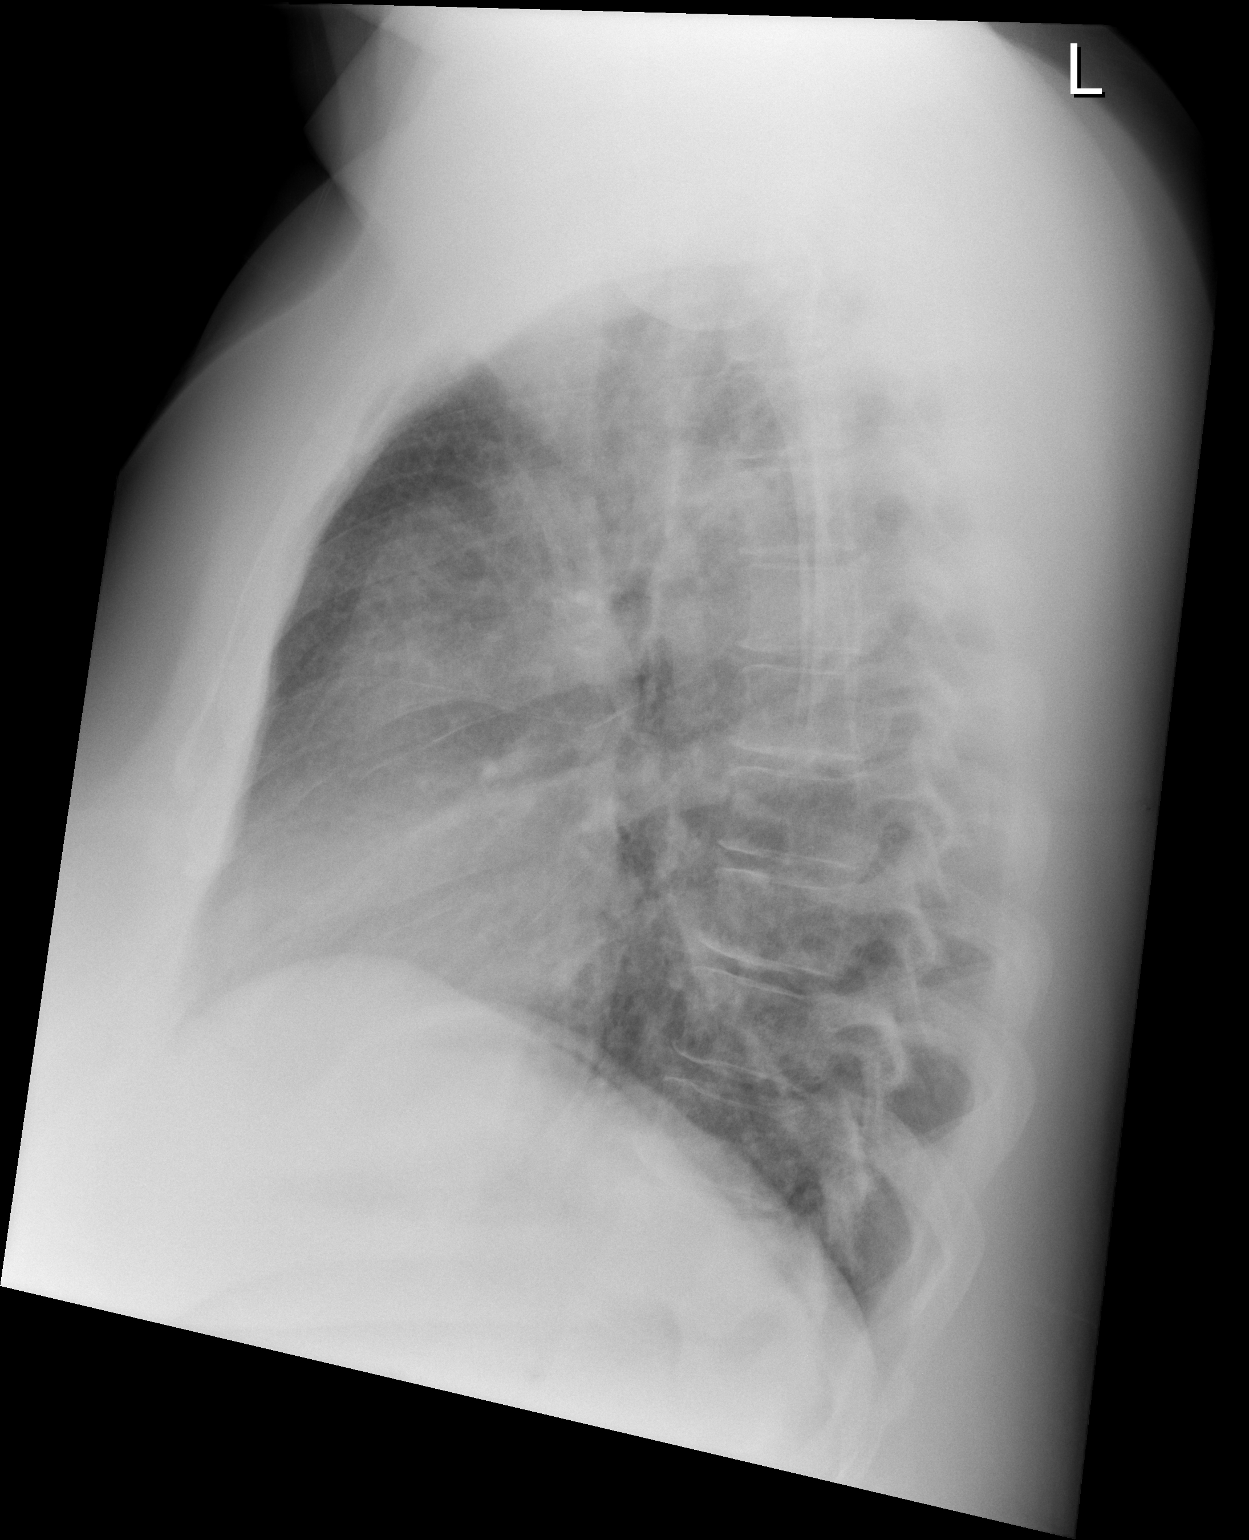

[2 of 2 positions shown; findings below may reference images not displayed]

FINDINGS: The lungs are clear without focal infiltrate, edema,
pneumothorax or pleural effusion. Interstitial markings are
diffusely coarsened with chronic features. The cardiopericardial
silhouette is within normal limits for size. Imaged bony structures
of the thorax are intact.
IMPRESSION: Stable.  There is some chronic interstitial coarsening but no edema
or focal pneumonia.

## 2012-12-15 IMAGING — CR DG CHEST 2V
2 series · 2 of 2 positions shown · non-contrast
Comparison: Chest x-ray of 12/11/2010

CLINICAL DATA: Chest pain,

CHEST - 2 VIEW

[w chest pa]
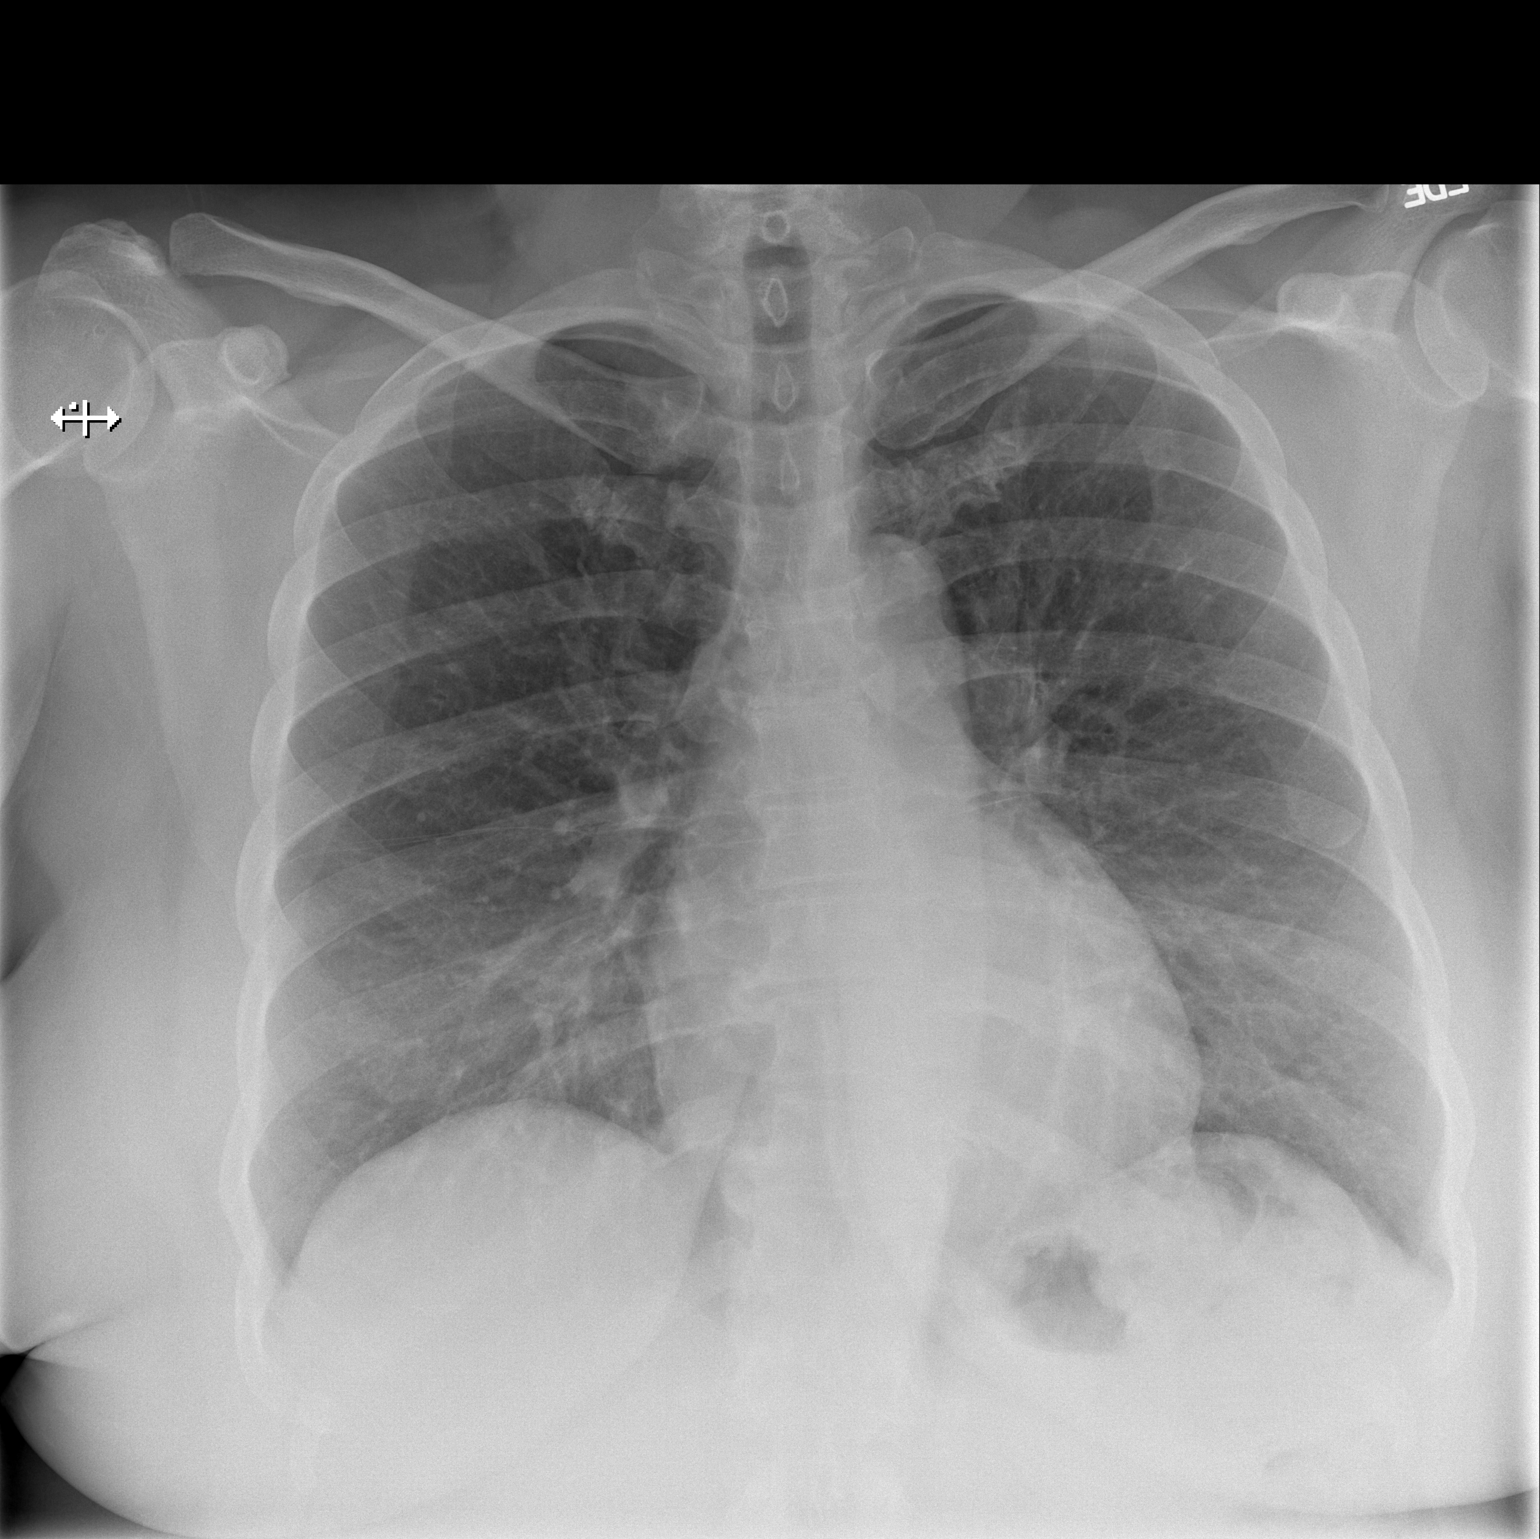

[w chest lat]
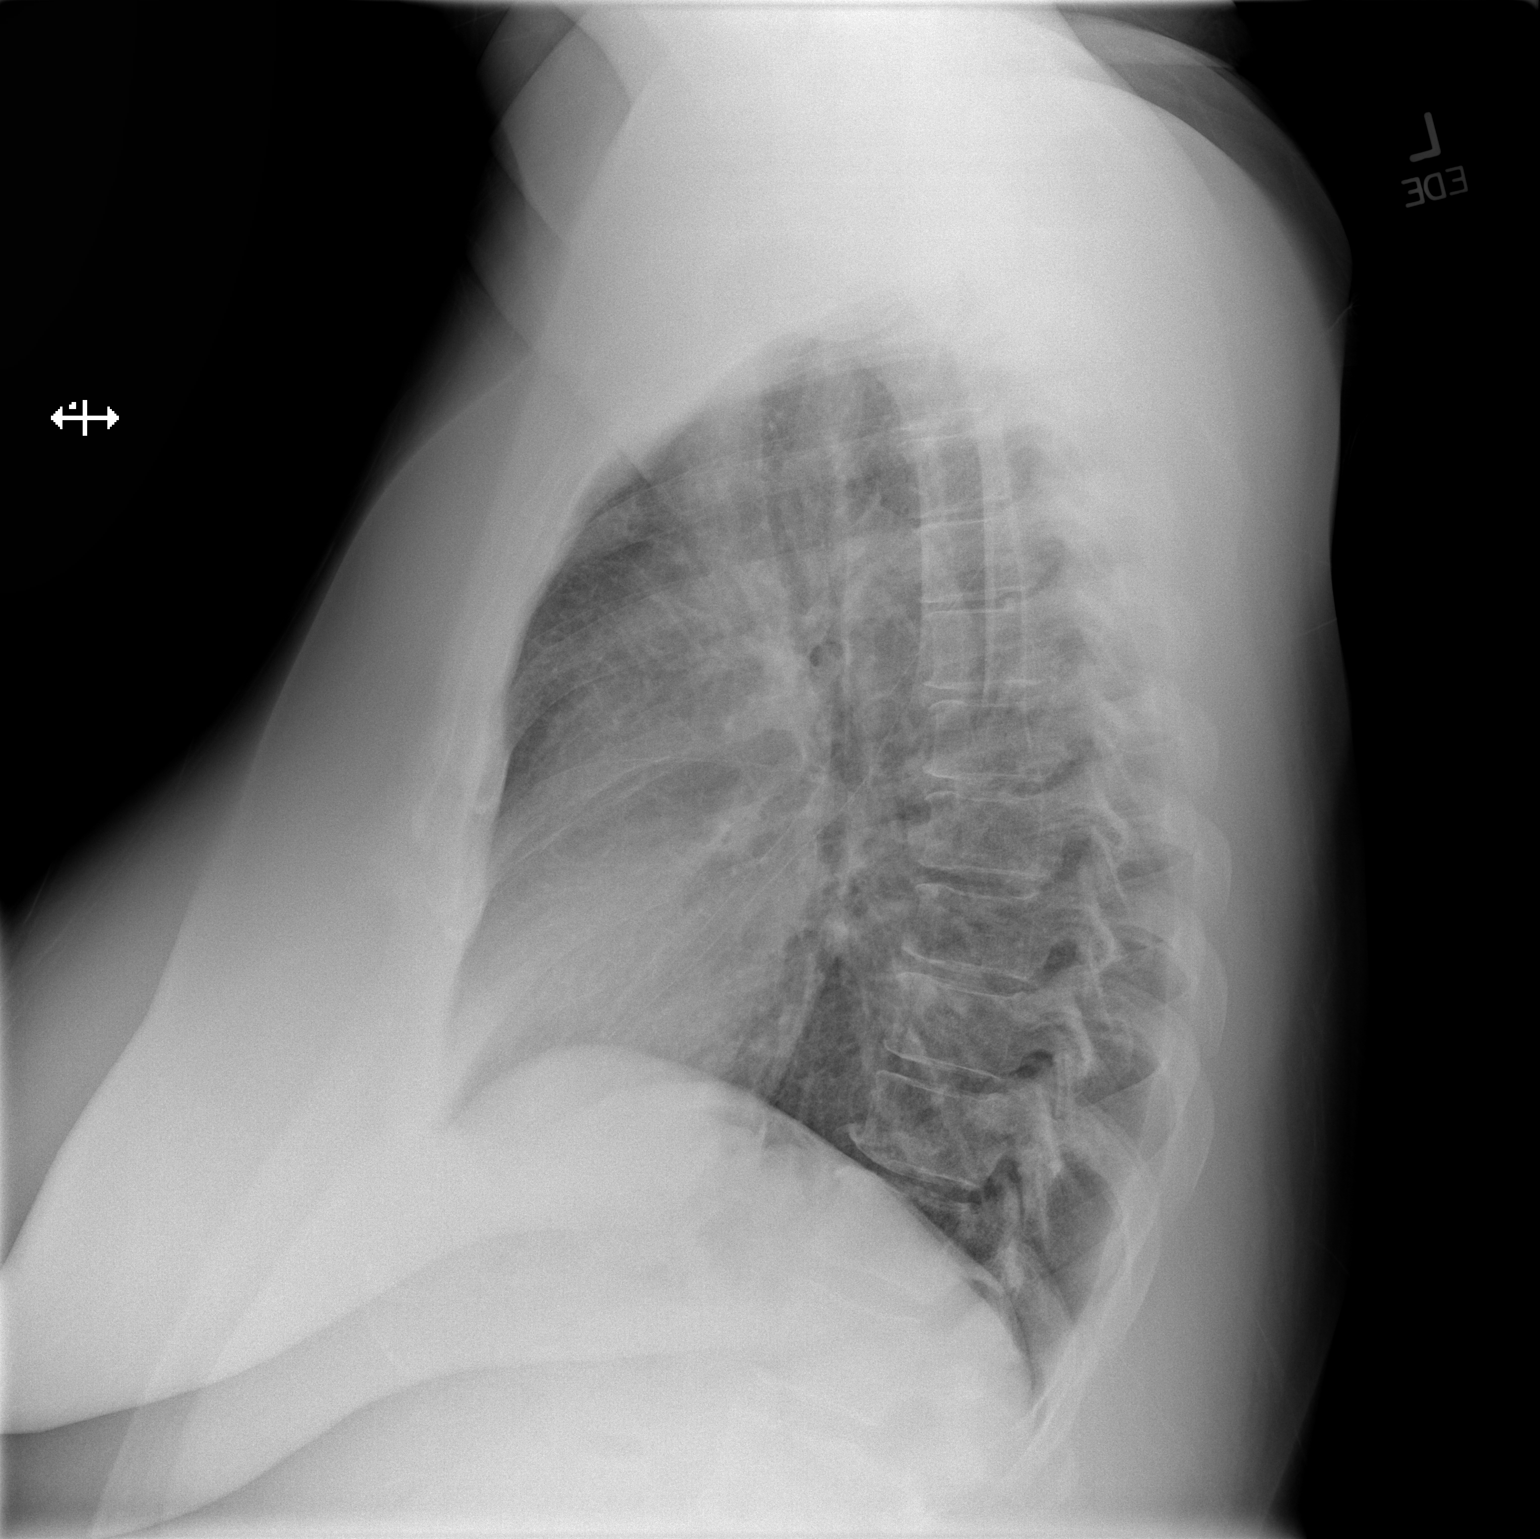

[2 of 2 positions shown; findings below may reference images not displayed]

FINDINGS: No active infiltrate or effusion is seen.  Mediastinal
contours are stable.  The heart is within normal limits in size.
There are mild degenerative changes in the mid to lower thoracic
spine.
IMPRESSION: Stable chest x-ray.  No active lung disease.

## 2013-03-03 ENCOUNTER — Emergency Department (HOSPITAL_COMMUNITY)
Admission: EM | Admit: 2013-03-03 | Discharge: 2013-03-03 | Disposition: A | Discharge status: Home / Self Care | Payer: 59 | Attending: Emergency Medicine | Admitting: Emergency Medicine

## 2013-03-03 ENCOUNTER — Encounter (HOSPITAL_COMMUNITY): Payer: Self-pay | Admitting: Emergency Medicine

## 2013-03-03 DIAGNOSIS — M129 Arthropathy, unspecified: Secondary | ICD-10-CM | POA: Insufficient documentation

## 2013-03-03 DIAGNOSIS — Z791 Long term (current) use of non-steroidal anti-inflammatories (NSAID): Secondary | ICD-10-CM | POA: Insufficient documentation

## 2013-03-03 DIAGNOSIS — Z79899 Other long term (current) drug therapy: Secondary | ICD-10-CM | POA: Insufficient documentation

## 2013-03-03 DIAGNOSIS — F17200 Nicotine dependence, unspecified, uncomplicated: Secondary | ICD-10-CM | POA: Insufficient documentation

## 2013-03-03 DIAGNOSIS — Z880 Allergy status to penicillin: Secondary | ICD-10-CM | POA: Insufficient documentation

## 2013-03-03 DIAGNOSIS — K047 Periapical abscess without sinus: Secondary | ICD-10-CM

## 2013-03-03 DIAGNOSIS — IMO0002 Reserved for concepts with insufficient information to code with codable children: Secondary | ICD-10-CM | POA: Insufficient documentation

## 2013-03-03 DIAGNOSIS — I10 Essential (primary) hypertension: Secondary | ICD-10-CM | POA: Insufficient documentation

## 2013-03-03 DIAGNOSIS — Z7982 Long term (current) use of aspirin: Secondary | ICD-10-CM | POA: Insufficient documentation

## 2013-03-03 DIAGNOSIS — J45909 Unspecified asthma, uncomplicated: Secondary | ICD-10-CM | POA: Insufficient documentation

## 2013-03-03 DIAGNOSIS — E119 Type 2 diabetes mellitus without complications: Secondary | ICD-10-CM | POA: Insufficient documentation

## 2013-03-03 MED ORDER — CLINDAMYCIN HCL 150 MG PO CAPS
300.0000 mg | ORAL_CAPSULE | Freq: Once | ORAL | Status: AC
  Administered 2013-03-03: 300 mg via ORAL
  Filled 2013-03-03: qty 2

## 2013-03-03 MED ORDER — OXYCODONE-ACETAMINOPHEN 5-325 MG PO TABS
1.0000 | ORAL_TABLET | Freq: Once | ORAL | Status: AC
  Administered 2013-03-03: 1 via ORAL
  Filled 2013-03-03: qty 1

## 2013-03-03 MED ORDER — CLINDAMYCIN HCL 150 MG PO CAPS: 300.0000 mg | ORAL_CAPSULE | Freq: Three times a day (TID) | ORAL | Status: DC

## 2013-03-03 MED ORDER — OXYCODONE-ACETAMINOPHEN 5-325 MG PO TABS: 1.0000 | ORAL_TABLET | ORAL | Status: DC | PRN

## 2013-03-03 MED ORDER — IBUPROFEN 800 MG PO TABS: 800.0000 mg | ORAL_TABLET | Freq: Three times a day (TID) | ORAL | Status: DC

## 2013-03-03 MED ORDER — KETOROLAC TROMETHAMINE 60 MG/2ML IM SOLN
60.0000 mg | Freq: Once | INTRAMUSCULAR | Status: AC
  Administered 2013-03-03: 60 mg via INTRAMUSCULAR
  Filled 2013-03-03: qty 2

## 2013-03-03 NOTE — ED Provider Notes (Signed)
CSN: [id number]     Arrival date & time 03/03/13  1738 History  This chart was scribed for non-physician practitioner Jaynie Crumble, PA-C, working with Gerhard Munch, MD by Dorothey Baseman, ED Scribe. This patient was seen in room TR06C/TR06C and the patient's care was started at 5:58 PM.    Chief Complaint  Patient presents with  . Dental Pain   The history is provided by the patient. No language interpreter was used.   HPI Comments: Courtney Payne is a 47 y.o. female who presents to the Emergency Department complaining of a constant pain to the left lower dentition onset 4 days ago that she states feels like an abscess. Patient reports taking antibiotics, cefaclor, at home for 2-3 days, which she states was able to provide some relief of her symptoms until today. Patient has an allergy to penicillins. Patient also has a history of DM, HTN, arthritis, and asthma.   Past Medical History  Diagnosis Date  . Diabetes mellitus   . Hypertension   . Arthritis   . Asthma    Past Surgical History  Procedure Laterality Date  . Foot surgery  01/27/11    right foot for ganglion cyst   History reviewed. No pertinent family history. History  Substance Use Topics  . Smoking status: Current Every Day Smoker -- 0.50 packs/day    Types: Cigarettes  . Smokeless tobacco: Not on file  . Alcohol Use: No   OB History   Grav Para Term Preterm Abortions TAB SAB Ect Mult Living                 Review of Systems  HENT: Positive for dental problem.   All other systems reviewed and are negative.    Allergies  Penicillins  Home Medications   Current Outpatient Rx  Name  Route  Sig  Dispense  Refill  . albuterol (PROVENTIL HFA;VENTOLIN HFA) 108 (90 BASE) MCG/ACT inhaler   Inhalation   Inhale 2 puffs into the lungs every 6 (six) hours as needed. For shortness of breath         . albuterol (PROVENTIL HFA;VENTOLIN HFA) 108 (90 BASE) MCG/ACT inhaler   Inhalation   Inhale 1-2 puffs into the  lungs every 6 (six) hours as needed for wheezing.   1 Inhaler   0   . Ascorbic Acid (VITAMIN C PO)   Oral   Take 1 tablet by mouth daily.           Marland Kitchen aspirin EC 81 MG tablet   Oral   Take 81 mg by mouth daily.           Marland Kitchen azithromycin (ZITHROMAX Z-PAK) 250 MG tablet      Take as directed.   6 tablet   0   . benzonatate (TESSALON) 200 MG capsule   Oral   Take 1 capsule (200 mg total) by mouth 3 (three) times daily as needed for cough.   30 capsule   0   . CALCIUM PO   Oral   Take 1 tablet by mouth daily.         . fish oil-omega-3 fatty acids 1000 MG capsule   Oral   Take 1 g by mouth daily.           Marland Kitchen glipiZIDE (GLUCOTROL) 10 MG tablet   Oral   Take 10 mg by mouth daily.           Marland Kitchen HYDROcodone-acetaminophen (NORCO/VICODIN) 5-325 MG per tablet  1 to 2 tabs every 4 to 6 hours as needed for pain.   20 tablet   0   . ibuprofen (ADVIL,MOTRIN) 800 MG tablet   Oral   Take 800 mg by mouth 2 (two) times daily as needed. For pain          . metFORMIN (GLUCOPHAGE) 1000 MG tablet   Oral   Take 1,000 mg by mouth 2 (two) times daily with a meal.           . naproxen (NAPROSYN) 250 MG tablet   Oral   Take 500 mg by mouth 2 (two) times daily with a meal.         . olmesartan-hydrochlorothiazide (BENICAR HCT) 40-25 MG per tablet   Oral   Take 1 tablet by mouth daily.         . orphenadrine (NORFLEX) 100 MG tablet   Oral   Take 100 mg by mouth 2 (two) times daily.         Marland Kitchen oxyCODONE-acetaminophen (PERCOCET/ROXICET) 5-325 MG per tablet   Oral   Take 1 tablet by mouth every 4 (four) hours as needed. For pain         . PredniSONE 5 MG KIT   Oral   Take 1 kit (5 mg total) by mouth daily after breakfast. Prednisone 5 mg 6 day dosepack.  Take as directed.   1 kit   0   . simvastatin (ZOCOR) 10 MG tablet   Oral   Take 10 mg by mouth at bedtime.            Triage Vitals: BP 152/95  Pulse 90  Temp(Src) 98.8 F (37.1 C) (Oral)  Resp  20  SpO2 100%  Physical Exam  Nursing note and vitals reviewed. Constitutional: She is oriented to person, place, and time. She appears well-developed and well-nourished. No distress.  HENT:  Head: Normocephalic and atraumatic.  Right Ear: Hearing, tympanic membrane, external ear and ear canal normal.  Left Ear: Hearing, tympanic membrane, external ear and ear canal normal.  Multiple dental cavities, most molars and pre-molars are eroded to the gumline. There is swelling and an abscess are the left, lower 1st molar that is tender to palpation. No swelling under the tongue. No trismus. No obvious facial swelling.  Eyes: Conjunctivae are normal.  Neck: Normal range of motion. Neck supple.  Pulmonary/Chest: Effort normal. No respiratory distress.  Abdominal: She exhibits no distension.  Musculoskeletal: Normal range of motion.  Neurological: She is alert and oriented to person, place, and time.  Skin: Skin is warm and dry.  Psychiatric: She has a normal mood and affect. Her behavior is normal.    ED Course  Procedures (including critical care time)  DIAGNOSTIC STUDIES: Oxygen Saturation is 100% on room air, normal by my interpretation.    COORDINATION OF CARE: 6:01 PM- Discussed that symptoms are due to a dental abscess that has become infected. Advised patient to follow up with the referred dentist. Will discharge patient with pain medication and antibiotics to manage symptoms. Advised patient to use Listerine rinses and to apply warm compresses to the area at home. Discussed treatment plan with patient at bedside and patient verbalized agreement.     Labs Review Labs Reviewed - No data to display Imaging Review No results found.  EKG Interpretation   None       MDM   1. Dental abscess     Patient with poor dentition and multiple eroded teeth. Here  with what appears to be an abscess to the left lower mandible. She has taken approximately 2 days worth of cefaclor with  some improvement however when stopped taking in pain and swelling came back. Patient is here afebrile, nontoxic appearing. Will start on clindamycin, pain management, followup with oral surgeon.  Filed Vitals:   03/03/13 1743  BP: 152/95  Pulse: 90  Temp: 98.8 F (37.1 C)  TempSrc: Oral  Resp: 20  SpO2: 100%     I personally performed the services described in this documentation, which was scribed in my presence. The recorded information has been reviewed and is accurate.     Lottie Mussel, PA-C 03/03/13 2032

## 2013-03-03 NOTE — ED Notes (Signed)
Pt c/o left lower dental pain x 4 days; pt sts taking antibiotics but still having pain

## 2013-03-04 NOTE — ED Provider Notes (Signed)
  Medical screening examination/treatment/procedure(s) were performed by non-physician practitioner and as supervising physician I was immediately available for consultation/collaboration.  EKG Interpretation   None         Jaremy Nosal, MD 03/04/13 0012 

## 2013-03-22 ENCOUNTER — Encounter (HOSPITAL_COMMUNITY): Payer: Self-pay | Admitting: Emergency Medicine

## 2013-03-22 ENCOUNTER — Emergency Department (INDEPENDENT_AMBULATORY_CARE_PROVIDER_SITE_OTHER)
Admission: EM | Admit: 2013-03-22 | Discharge: 2013-03-22 | Disposition: A | Discharge status: Home / Self Care | Payer: 59 | Source: Home / Self Care | Attending: Family Medicine | Admitting: Family Medicine

## 2013-03-22 DIAGNOSIS — J40 Bronchitis, not specified as acute or chronic: Secondary | ICD-10-CM

## 2013-03-22 MED ORDER — IPRATROPIUM BROMIDE 0.02 % IN SOLN
0.5000 mg | Freq: Once | RESPIRATORY_TRACT | Status: AC
  Administered 2013-03-22: 0.5 mg via RESPIRATORY_TRACT

## 2013-03-22 MED ORDER — IPRATROPIUM BROMIDE 0.02 % IN SOLN
RESPIRATORY_TRACT | Status: AC
  Filled 2013-03-22: qty 2.5

## 2013-03-22 MED ORDER — ALBUTEROL SULFATE (5 MG/ML) 0.5% IN NEBU
5.0000 mg | INHALATION_SOLUTION | Freq: Once | RESPIRATORY_TRACT | Status: AC
  Administered 2013-03-22: 5 mg via RESPIRATORY_TRACT

## 2013-03-22 MED ORDER — PREDNISONE 10 MG PO TABS: 30.0000 mg | ORAL_TABLET | Freq: Every day | ORAL | Status: DC

## 2013-03-22 MED ORDER — GUAIFENESIN-CODEINE 100-10 MG/5ML PO SOLN
5.0000 mL | Freq: Every evening | ORAL | Status: DC | PRN
Start: 2013-03-22 — End: 2016-02-28

## 2013-03-22 MED ORDER — AZITHROMYCIN 250 MG PO TABS: 250.0000 mg | ORAL_TABLET | Freq: Every day | ORAL | Status: DC

## 2013-03-22 MED ORDER — IPRATROPIUM BROMIDE 0.06 % NA SOLN: 2.0000 | Freq: Four times a day (QID) | NASAL | Status: DC

## 2013-03-22 NOTE — ED Provider Notes (Signed)
Courtney Payne is a 47 y.o. female who presents to Urgent Care today for one day of sneezing cough congestion body aches and wheezing. Patient has tried DayQuil and NyQuil which seem to help. She does not runny nose as well. No trouble breathing nausea vomiting or diarrhea. Patient feels well otherwise. No significant chest pains or palpitations.  She has a history of asthma but has never been diagnosed with COPD. She is a current smoker.   Past Medical History  Diagnosis Date  . Diabetes mellitus   . Hypertension   . Arthritis   . Asthma    History  Substance Use Topics  . Smoking status: Current Every Day Smoker -- 0.50 packs/day    Types: Cigarettes  . Smokeless tobacco: Not on file  . Alcohol Use: No   ROS as above Medications reviewed. No current facility-administered medications for this encounter.   Current Outpatient Prescriptions  Medication Sig Dispense Refill  . albuterol (PROVENTIL HFA;VENTOLIN HFA) 108 (90 BASE) MCG/ACT inhaler Inhale 2 puffs into the lungs every 6 (six) hours as needed. For shortness of breath      . Ascorbic Acid (VITAMIN C PO) Take 1 tablet by mouth daily.        Marland Kitchen aspirin EC 81 MG tablet Take 81 mg by mouth daily.        Marland Kitchen azithromycin (ZITHROMAX) 250 MG tablet Take 1 tablet (250 mg total) by mouth daily. Take first 2 tablets together, then 1 every day until finished.  6 tablet  0  . benzonatate (TESSALON) 200 MG capsule Take 1 capsule (200 mg total) by mouth 3 (three) times daily as needed for cough.  30 capsule  0  . CALCIUM PO Take 1 tablet by mouth daily.      . clindamycin (CLEOCIN) 150 MG capsule Take 2 capsules (300 mg total) by mouth 3 (three) times daily.  60 capsule  0  . fish oil-omega-3 fatty acids 1000 MG capsule Take 1 g by mouth daily.        Marland Kitchen glipiZIDE (GLUCOTROL) 10 MG tablet Take 10 mg by mouth daily.        Marland Kitchen guaiFENesin-codeine 100-10 MG/5ML syrup Take 5 mLs by mouth at bedtime as needed for cough.  120 mL  0  . ibuprofen  (ADVIL,MOTRIN) 800 MG tablet Take 800 mg by mouth 2 (two) times daily as needed. For pain       . ibuprofen (ADVIL,MOTRIN) 800 MG tablet Take 1 tablet (800 mg total) by mouth 3 (three) times daily.  21 tablet  0  . ipratropium (ATROVENT) 0.06 % nasal spray Place 2 sprays into both nostrils 4 (four) times daily.  15 mL  1  . metFORMIN (GLUCOPHAGE) 1000 MG tablet Take 1,000 mg by mouth 2 (two) times daily with a meal.        . naproxen (NAPROSYN) 250 MG tablet Take 500 mg by mouth 2 (two) times daily with a meal.      . olmesartan-hydrochlorothiazide (BENICAR HCT) 40-25 MG per tablet Take 1 tablet by mouth daily.      Marland Kitchen oxyCODONE-acetaminophen (PERCOCET) 5-325 MG per tablet Take 1 tablet by mouth every 4 (four) hours as needed for severe pain.  20 tablet  0  . oxyCODONE-acetaminophen (PERCOCET/ROXICET) 5-325 MG per tablet Take 1 tablet by mouth every 4 (four) hours as needed. For pain      . predniSONE (DELTASONE) 10 MG tablet Take 3 tablets (30 mg total) by mouth daily.  15  tablet  0  . simvastatin (ZOCOR) 10 MG tablet Take 10 mg by mouth at bedtime.          Exam:  BP 150/88  Pulse 86  Temp(Src) 98.3 F (36.8 C) (Oral)  Resp 18  SpO2 98% Gen: Well NAD HEENT: EOMI,  MMM Lungs: Normal work of breathing. Prolonged expiratory phase and coarse breath sounds with mild wheezing present bilaterally. Heart: RRR no MRG Abd: NABS, Soft. NT, ND Exts: , warm and well perfused.   Patient was given a DuoNeb nebulizer treatment, and felt much better.    Assessment and Plan: 47 y.o. female with bronchitis. Plan to treat with low-dose prednisone (due to diabetes), home albuterol, azithromycin, codeine containing cough medication, and Atrovent nasal spray. Work note provided. Discussed warning signs or symptoms. Please see discharge instructions. Patient expresses understanding.      Rodolph Bong, MD 03/22/13 [phone]

## 2013-03-22 NOTE — ED Notes (Signed)
Cough, congestion, fever,sneezing
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
## 2015-06-20 DIAGNOSIS — E119 Type 2 diabetes mellitus without complications: Secondary | ICD-10-CM | POA: Insufficient documentation

## 2015-06-20 DIAGNOSIS — I10 Essential (primary) hypertension: Secondary | ICD-10-CM | POA: Insufficient documentation

## 2016-02-28 ENCOUNTER — Ambulatory Visit (HOSPITAL_COMMUNITY)
Admission: EM | Admit: 2016-02-28 | Discharge: 2016-02-28 | Disposition: A | Discharge status: Home / Self Care | Payer: BLUE CROSS/BLUE SHIELD | Attending: Emergency Medicine | Admitting: Emergency Medicine

## 2016-02-28 ENCOUNTER — Encounter (HOSPITAL_COMMUNITY): Payer: Self-pay | Admitting: Emergency Medicine

## 2016-02-28 DIAGNOSIS — E1142 Type 2 diabetes mellitus with diabetic polyneuropathy: Secondary | ICD-10-CM

## 2016-02-28 DIAGNOSIS — L930 Discoid lupus erythematosus: Secondary | ICD-10-CM | POA: Diagnosis not present

## 2016-02-28 DIAGNOSIS — M25561 Pain in right knee: Secondary | ICD-10-CM

## 2016-02-28 HISTORY — DX: Systemic lupus erythematosus, unspecified: M32.9

## 2016-02-28 HISTORY — DX: Reserved for concepts with insufficient information to code with codable children: IMO0002

## 2016-02-28 MED ORDER — PREDNISONE 50 MG PO TABS
ORAL_TABLET | ORAL | 0 refills | Status: AC
Start: 2016-02-28 — End: ?

## 2016-02-28 MED ORDER — HYDROCODONE-ACETAMINOPHEN 5-325 MG PO TABS: 1.0000 | ORAL_TABLET | ORAL | 0 refills | Status: DC | PRN

## 2016-02-28 NOTE — ED Triage Notes (Signed)
Pt has a history of Lupus & DM and believes she is having a flair up of her Lupus affecting her knee.  She has had swelling, tightness, "popping", and pain for about 5 days.  She has taken her regular medications with no relief.

## 2016-02-28 NOTE — ED Provider Notes (Signed)
CSN: [id number]     Arrival date & time 02/28/16  1447 History   First MD Initiated Contact with Patient 02/28/16 1516     Chief Complaint  Patient presents with  . Knee Pain    right   (Consider location/radiation/quality/duration/timing/severity/associated sxs/prior Treatment) 50 year old female states she has lupus and type 2 diabetes mellitus is complaining of a lupus flareup specifically affecting her right knee. She has swelling and pain particularly with weightbearing. It started approximate 2 days ago. She states her knee is weak and gives out on her. She is also complaining of surrounding muscle pain in both lower extremities she attributes to diabetic neuropathy. She takes [place], gabapentin and tramadol and another NSAID for pain and she has taken all of those today and her knee is not getting any better. She states she called her PCP and there were unable to get her in the office today. Denies any type of trauma or fall.      Past Medical History:  Diagnosis Date  . Arthritis   . Asthma   . Diabetes mellitus   . Hypertension   . Lupus    Past Surgical History:  Procedure Laterality Date  . FOOT SURGERY  01/27/11   right foot for ganglion cyst   History reviewed. No pertinent family history. Social History  Substance Use Topics  . Smoking status: Current Every Day Smoker    Packs/day: 0.33    Types: Cigarettes  . Smokeless tobacco: Never Used  . Alcohol use No   OB History    No data available     Review of Systems  Constitutional: Negative.   HENT: Negative.   Respiratory: Negative.   Gastrointestinal: Negative.   Genitourinary: Negative.   Musculoskeletal: Positive for gait problem, joint swelling and myalgias. Negative for back pain.  Skin: Negative.   All other systems reviewed and are negative.   Allergies  Penicillins  Home Medications   Prior to Admission medications   Medication Sig Start Date End Date Taking? Authorizing Provider   albuterol (PROVENTIL HFA;VENTOLIN HFA) 108 (90 BASE) MCG/ACT inhaler Inhale 2 puffs into the lungs every 6 (six) hours as needed. For shortness of breath   Yes Historical Provider, MD  aspirin EC 81 MG tablet Take 81 mg by mouth daily.     Yes Historical Provider, MD  Diclofenac Potassium 50 MG PACK Take 50 mg by mouth.   Yes Historical Provider, MD  gabapentin (NEURONTIN) 300 MG capsule Take 300 mg by mouth 3 (three) times daily.   Yes Historical Provider, MD  glipiZIDE (GLUCOTROL) 10 MG tablet Take 10 mg by mouth daily.     Yes Historical Provider, MD  hydroxychloroquine (PLAQUENIL) 200 MG tablet Take 200 mg by mouth 2 (two) times daily.   Yes Historical Provider, MD  metFORMIN (GLUCOPHAGE) 1000 MG tablet Take 1,000 mg by mouth 2 (two) times daily with a meal.     Yes Historical Provider, MD  omeprazole (PRILOSEC) 20 MG capsule Take 20 mg by mouth daily.   Yes Historical Provider, MD  simvastatin (ZOCOR) 10 MG tablet Take 10 mg by mouth at bedtime.     Yes Historical Provider, MD  traMADol (ULTRAM) 50 MG tablet Take by mouth every 8 (eight) hours as needed.   Yes Historical Provider, MD  triamcinolone cream (KENALOG) 0.1 % Apply 1 application topically 2 (two) times daily.   Yes Historical Provider, MD  HYDROcodone-acetaminophen (NORCO/VICODIN) 5-325 MG tablet Take 1 tablet by mouth every 4 (four)  hours as needed. 02/28/16   Hayden Rasmussenavid Nevelyn Mellott, NP  ibuprofen (ADVIL,MOTRIN) 800 MG tablet Take 800 mg by mouth 2 (two) times daily as needed. For pain     Historical Provider, MD  ibuprofen (ADVIL,MOTRIN) 800 MG tablet Take 1 tablet (800 mg total) by mouth 3 (three) times daily. 03/03/13   Tatyana Kirichenko, PA-C  predniSONE (DELTASONE) 50 MG tablet 1 tab po daily for 6 days. Take with food. 02/28/16   Hayden Rasmussenavid Bader Stubblefield, NP   Meds Ordered and Administered this Visit  Medications - No data to display  BP 167/90 (BP Location: Left Arm)   Pulse 73   Temp 98.3 F (36.8 C) (Oral)   LMP 11/23/2011   SpO2 98%  No  data found.   Physical Exam  Constitutional: She is oriented to person, place, and time. She appears well-developed and well-nourished. No distress.  HENT:  Head: Normocephalic and atraumatic.  Neck: Normal range of motion. Neck supple.  Cardiovascular: Normal rate.   Pulmonary/Chest: Effort normal.  Musculoskeletal: She exhibits no deformity.  Right knee with non-tense swelling. Antalgic gait. Extension is intact however flexion is limited. Mild tenderness to the distal medial knee. The knee looks little puffy however the medial and lateral joint spaces do not protrude or palpate as an effusion. No joint line tenderness. No increase in warmth. No evidence of infection or cellulitis.  Neurological: She is alert and oriented to person, place, and time.  Skin: Skin is warm and dry. Capillary refill takes less than 2 seconds.  Psychiatric: She has a normal mood and affect.  Nursing note and vitals reviewed.   Urgent Care Course   Clinical Course     Procedures (including critical care time)  Labs Review Labs Reviewed - No data to display  Imaging Review No results found.   Visual Acuity Review  Right Eye Distance:   Left Eye Distance:   Bilateral Distance:    Right Eye Near:   Left Eye Near:    Bilateral Near:         MDM   1. Acute pain of right knee   2. Lupus erythematosus, unspecified form   3. Type 2 diabetes mellitus with diabetic polyneuropathy, without long-term current use of insulin (HCC)    Take the medication as directed. Be sure to follow-up with your primary care doctor as soon as possible. He may need referral to a rheumatologist. Meds ordered this encounter  Medications  . Diclofenac Potassium 50 MG PACK    Sig: Take 50 mg by mouth.  Marland Kitchen. omeprazole (PRILOSEC) 20 MG capsule    Sig: Take 20 mg by mouth daily.  . traMADol (ULTRAM) 50 MG tablet    Sig: Take by mouth every 8 (eight) hours as needed.  . hydroxychloroquine (PLAQUENIL) 200 MG tablet     Sig: Take 200 mg by mouth 2 (two) times daily.  Marland Kitchen. gabapentin (NEURONTIN) 300 MG capsule    Sig: Take 300 mg by mouth 3 (three) times daily.  Marland Kitchen. triamcinolone cream (KENALOG) 0.1 %    Sig: Apply 1 application topically 2 (two) times daily.  Marland Kitchen. HYDROcodone-acetaminophen (NORCO/VICODIN) 5-325 MG tablet    Sig: Take 1 tablet by mouth every 4 (four) hours as needed.    Dispense:  15 tablet    Refill:  0    Order Specific Question:   Supervising Provider    Answer:   Charm RingsHONIG, ERIN J [id number]]  . predniSONE (DELTASONE) 50 MG tablet    Sig: 1 tab  po daily for 6 days. Take with food.    Dispense:  6 tablet    Refill:  0    Order Specific Question:   Supervising Provider    Answer:   Charm RingsHONIG, ERIN J [id number]]       Hayden Rasmussenavid Jayley Hustead, NP 02/28/16 1601

## 2016-02-28 NOTE — Discharge Instructions (Signed)
Take the medication as directed. Be sure to follow-up with your primary care doctor as soon as possible. He may need referral to a rheumatologist.

## 2016-03-14 DIAGNOSIS — M329 Systemic lupus erythematosus, unspecified: Secondary | ICD-10-CM | POA: Insufficient documentation

## 2016-07-08 ENCOUNTER — Encounter (HOSPITAL_COMMUNITY): Payer: Self-pay | Admitting: Emergency Medicine

## 2016-07-08 ENCOUNTER — Emergency Department (HOSPITAL_COMMUNITY): Payer: BLUE CROSS/BLUE SHIELD

## 2016-07-08 ENCOUNTER — Emergency Department (HOSPITAL_COMMUNITY)
Admission: EM | Admit: 2016-07-08 | Discharge: 2016-07-08 | Disposition: A | Discharge status: Home / Self Care | Payer: BLUE CROSS/BLUE SHIELD | Attending: Emergency Medicine | Admitting: Emergency Medicine

## 2016-07-08 DIAGNOSIS — Z7982 Long term (current) use of aspirin: Secondary | ICD-10-CM | POA: Insufficient documentation

## 2016-07-08 DIAGNOSIS — Z7984 Long term (current) use of oral hypoglycemic drugs: Secondary | ICD-10-CM | POA: Insufficient documentation

## 2016-07-08 DIAGNOSIS — J45909 Unspecified asthma, uncomplicated: Secondary | ICD-10-CM | POA: Insufficient documentation

## 2016-07-08 DIAGNOSIS — F17210 Nicotine dependence, cigarettes, uncomplicated: Secondary | ICD-10-CM | POA: Insufficient documentation

## 2016-07-08 DIAGNOSIS — R935 Abnormal findings on diagnostic imaging of other abdominal regions, including retroperitoneum: Secondary | ICD-10-CM | POA: Insufficient documentation

## 2016-07-08 DIAGNOSIS — M79604 Pain in right leg: Secondary | ICD-10-CM | POA: Insufficient documentation

## 2016-07-08 DIAGNOSIS — I10 Essential (primary) hypertension: Secondary | ICD-10-CM | POA: Insufficient documentation

## 2016-07-08 DIAGNOSIS — G8929 Other chronic pain: Secondary | ICD-10-CM | POA: Insufficient documentation

## 2016-07-08 DIAGNOSIS — M79605 Pain in left leg: Secondary | ICD-10-CM | POA: Insufficient documentation

## 2016-07-08 DIAGNOSIS — E119 Type 2 diabetes mellitus without complications: Secondary | ICD-10-CM | POA: Insufficient documentation

## 2016-07-08 LAB — CBC WITH DIFFERENTIAL/PLATELET
Basophils Absolute: 0.1 10*3/uL (ref 0.0–0.1)
Basophils Relative: 1 %
Eosinophils Absolute: 0.2 10*3/uL (ref 0.0–0.7)
Eosinophils Relative: 2 %
HCT: 40.4 % (ref 36.0–46.0)
Hemoglobin: 13.8 g/dL (ref 12.0–15.0)
Lymphocytes Relative: 48 %
Lymphs Abs: 4.3 10*3/uL — ABNORMAL HIGH (ref 0.7–4.0)
MCH: 30 pg (ref 26.0–34.0)
MCHC: 34.2 g/dL (ref 30.0–36.0)
MCV: 87.8 fL (ref 78.0–100.0)
Monocytes Absolute: 0.5 10*3/uL (ref 0.1–1.0)
Monocytes Relative: 5 %
Neutro Abs: 3.9 10*3/uL (ref 1.7–7.7)
Neutrophils Relative %: 44 %
Platelets: 197 10*3/uL (ref 150–400)
RBC: 4.6 MIL/uL (ref 3.87–5.11)
RDW: 13.5 % (ref 11.5–15.5)
WBC: 9 10*3/uL (ref 4.0–10.5)

## 2016-07-08 LAB — BASIC METABOLIC PANEL
Anion gap: 9 (ref 5–15)
BUN: 21 mg/dL — ABNORMAL HIGH (ref 6–20)
CO2: 26 mmol/L (ref 22–32)
Calcium: 9.9 mg/dL (ref 8.9–10.3)
Chloride: 104 mmol/L (ref 101–111)
Creatinine, Ser: 1.11 mg/dL — ABNORMAL HIGH (ref 0.44–1.00)
GFR calc Af Amer: >60 mL/min (ref >60)
GFR calc non Af Amer: 56 mL/min — ABNORMAL LOW (ref >60)
Glucose, Bld: 94 mg/dL (ref 65–99)
Potassium: 4.3 mmol/L (ref 3.5–5.1)
Sodium: 139 mmol/L (ref 135–145)

## 2016-07-08 MED ORDER — TRAMADOL HCL 50 MG PO TABS: 50.0000 mg | ORAL_TABLET | Freq: Four times a day (QID) | ORAL | 0 refills | Status: DC | PRN

## 2016-07-08 MED ORDER — IOPAMIDOL (ISOVUE-300) INJECTION 61%
INTRAVENOUS | Status: AC
  Filled 2016-07-08: qty 100

## 2016-07-08 MED ORDER — MORPHINE SULFATE (PF) 4 MG/ML IV SOLN
4.0000 mg | Freq: Once | INTRAVENOUS | Status: AC
  Administered 2016-07-08: 4 mg via INTRAVENOUS
  Filled 2016-07-08: qty 1

## 2016-07-08 MED ORDER — OXYCODONE-ACETAMINOPHEN 5-325 MG PO TABS
ORAL_TABLET | ORAL | Status: AC
  Filled 2016-07-08: qty 1

## 2016-07-08 MED ORDER — IOPAMIDOL (ISOVUE-300) INJECTION 61%
INTRAVENOUS | Status: AC
  Administered 2016-07-08: 100 mL
  Filled 2016-07-08: qty 100

## 2016-07-08 MED ORDER — OXYCODONE-ACETAMINOPHEN 5-325 MG PO TABS
1.0000 | ORAL_TABLET | ORAL | Status: DC | PRN
  Administered 2016-07-08: 1 via ORAL

## 2016-07-08 MED ORDER — TRAMADOL HCL 50 MG PO TABS
50.0000 mg | ORAL_TABLET | Freq: Once | ORAL | Status: AC
  Administered 2016-07-08: 50 mg via ORAL
  Filled 2016-07-08: qty 1

## 2016-07-08 NOTE — ED Provider Notes (Addendum)
[facility]-EMERGENCY DEPT Provider Note   CSN: [id number] Arrival date & time: 07/08/16  1517     History   Chief Complaint Chief Complaint  Patient presents with  . Leg Pain    HPI Courtney Payne is a 51 y.o. female.Complains of bilateral leg pain for 4 months. Pain at left buttock became worse today. Pain is worse with walking or weightbearing improved with remaining still . No other associated symptoms. No fever. No trauma. No treatment prior to coming here. Patient reports she is chronically on tramadol for leg pain however has run out  HPI  Past Medical History:  Diagnosis Date  . Arthritis   . Asthma   . Diabetes mellitus   . Hypertension   . Lupus     There are no active problems to display for this patient.   Past Surgical History:  Procedure Laterality Date  . FOOT SURGERY  01/27/11   right foot for ganglion cyst    OB History    No data available       Home Medications    Prior to Admission medications   Medication Sig Start Date End Date Taking? Authorizing Provider  albuterol (PROVENTIL HFA;VENTOLIN HFA) 108 (90 BASE) MCG/ACT inhaler Inhale 2 puffs into the lungs every 6 (six) hours as needed. For shortness of breath    Historical Provider, MD  aspirin EC 81 MG tablet Take 81 mg by mouth daily.      Historical Provider, MD  Diclofenac Potassium 50 MG PACK Take 50 mg by mouth.    Historical Provider, MD  gabapentin (NEURONTIN) 300 MG capsule Take 300 mg by mouth 3 (three) times daily.    Historical Provider, MD  glipiZIDE (GLUCOTROL) 10 MG tablet Take 10 mg by mouth daily.      Historical Provider, MD  HYDROcodone-acetaminophen (NORCO/VICODIN) 5-325 MG tablet Take 1 tablet by mouth every 4 (four) hours as needed. 02/28/16   Hayden Rasmussen, NP  hydroxychloroquine (PLAQUENIL) 200 MG tablet Take 200 mg by mouth 2 (two) times daily.    Historical Provider, MD  ibuprofen (ADVIL,MOTRIN) 800 MG tablet Take 800 mg by mouth 2 (two) times daily as needed. For pain      Historical Provider, MD  ibuprofen (ADVIL,MOTRIN) 800 MG tablet Take 1 tablet (800 mg total) by mouth 3 (three) times daily. 03/03/13   Tatyana Kirichenko, PA-C  metFORMIN (GLUCOPHAGE) 1000 MG tablet Take 1,000 mg by mouth 2 (two) times daily with a meal.      Historical Provider, MD  omeprazole (PRILOSEC) 20 MG capsule Take 20 mg by mouth daily.    Historical Provider, MD  predniSONE (DELTASONE) 50 MG tablet 1 tab po daily for 6 days. Take with food. 02/28/16   Hayden Rasmussen, NP  simvastatin (ZOCOR) 10 MG tablet Take 10 mg by mouth at bedtime.      Historical Provider, MD  traMADol (ULTRAM) 50 MG tablet Take by mouth every 8 (eight) hours as needed.    Historical Provider, MD  triamcinolone cream (KENALOG) 0.1 % Apply 1 application topically 2 (two) times daily.    Historical Provider, MD    Family History History reviewed. No pertinent family history.  Social History Social History  Substance Use Topics  . Smoking status: Current Every Day Smoker    Packs/day: 0.33    Types: Cigarettes  . Smokeless tobacco: Never Used  . Alcohol use No  Denies drug use   Allergies   Penicillins   Review of Systems Review of  Systems  Constitutional: Negative.   HENT: Negative.   Respiratory: Negative.   Cardiovascular: Negative.   Gastrointestinal: Negative.   Musculoskeletal: Positive for myalgias.       Bilateral leg pain and pain at left buttock  Skin: Negative.   Allergic/Immunologic: Positive for immunocompromised state.       Diabetic  Neurological: Negative.   Psychiatric/Behavioral: Negative.   All other systems reviewed and are negative.    Physical Exam Updated Vital Signs BP (!) 156/78   Pulse 68   Temp 97.6 F (36.4 C) (Oral)   Resp 16   LMP 12/26/2011   SpO2 97%   Physical Exam  Constitutional: She is oriented to person, place, and time. She appears well-developed and well-nourished.  HENT:  Head: Normocephalic and atraumatic.  Eyes: Conjunctivae are normal.  Pupils are equal, round, and reactive to light.  Neck: Neck supple. No tracheal deviation present. No thyromegaly present.  Cardiovascular: Normal rate and regular rhythm.   No murmur heard. Pulmonary/Chest: Effort normal and breath sounds normal.  Abdominal: Soft. Bowel sounds are normal. She exhibits no distension. There is no tenderness.  Obese  Musculoskeletal: Normal range of motion. She exhibits no edema or tenderness.  Bilateral lower extremities without redness swelling or tenderness. No pain on internal or external rotation of either thigh. DP pulses 2+ bilaterally. All other extremities without redness swelling or tenderness neurovascular intact  Neurological: She is alert and oriented to person, place, and time. She exhibits normal muscle tone. Coordination normal.  Gait normal  Skin: Skin is warm and dry. No rash noted.  Psychiatric: She has a normal mood and affect.  Nursing note and vitals reviewed.    ED Treatments / Results  Labs (all labs ordered are listed, but only abnormal results are displayed) Labs Reviewed  BASIC METABOLIC PANEL  CBC WITH DIFFERENTIAL/PLATELET    EKG  EKG Interpretation None       Radiology No results found.  Procedures Procedures (including critical care time)  Medications Ordered in ED Medications  oxyCODONE-acetaminophen (PERCOCET/ROXICET) 5-325 MG per tablet 1 tablet (1 tablet Oral Given 07/08/16 1532)  oxyCODONE-acetaminophen (PERCOCET/ROXICET) 5-325 MG per tablet (not administered)    Results for orders placed or performed during the hospital encounter of 07/08/16  Basic metabolic panel  Result Value Ref Range   Sodium 139 135 - 145 mmol/L   Potassium 4.3 3.5 - 5.1 mmol/L   Chloride 104 101 - 111 mmol/L   CO2 26 22 - 32 mmol/L   Glucose, Bld 94 65 - 99 mg/dL   BUN 21 (H) 6 - 20 mg/dL   Creatinine, Ser 4.09 (H) 0.44 - 1.00 mg/dL   Calcium 9.9 8.9 - 81.1 mg/dL   GFR calc non Af Amer 56 (L) >60 mL/min   GFR calc Af Amer  >60 >60 mL/min   Anion gap 9 5 - 15  CBC with Differential/Platelet  Result Value Ref Range   WBC 9.0 4.0 - 10.5 K/uL   RBC 4.60 3.87 - 5.11 MIL/uL   Hemoglobin 13.8 12.0 - 15.0 g/dL   HCT 91.4 78.2 - 95.6 %   MCV 87.8 78.0 - 100.0 fL   MCH 30.0 26.0 - 34.0 pg   MCHC 34.2 30.0 - 36.0 g/dL   RDW 21.3 08.6 - 57.8 %   Platelets 197 150 - 400 K/uL   Neutrophils Relative % 44 %   Neutro Abs 3.9 1.7 - 7.7 K/uL   Lymphocytes Relative 48 %   Lymphs Abs  4.3 (H) 0.7 - 4.0 K/uL   Monocytes Relative 5 %   Monocytes Absolute 0.5 0.1 - 1.0 K/uL   Eosinophils Relative 2 %   Eosinophils Absolute 0.2 0.0 - 0.7 K/uL   Basophils Relative 1 %   Basophils Absolute 0.1 0.0 - 0.1 K/uL   Ct Pelvis W Contrast  Result Date: 07/08/2016 CLINICAL DATA:  51 y/o F; right-sided buttocks pain started this morning. History of lupus and diabetes. EXAM: CT PELVIS WITH CONTRAST TECHNIQUE: Multidetector CT imaging of the pelvis was performed using the standard protocol following the bolus administration of intravenous contrast. CONTRAST:  ISOVUE-300 IOPAMIDOL (ISOVUE-300) INJECTION 61% COMPARISON:  None. FINDINGS: Urinary Tract:  No abnormality visualized. Bowel:  Unremarkable visualized pelvic bowel loops. Vascular/Lymphatic: No pathologically enlarged lymph nodes. Extensive calcific atherosclerosis of the visible abdominal aorta and bilateral iliofemoral arteries. No large vessel occlusion. Reproductive:  No mass or other significant abnormality Other:  None. Musculoskeletal: No suspicious bone lesions identified. Mild lower lumbar spine discogenic and facet degenerative changes. IMPRESSION: 1. No acute process identified as explanation for pain. 2. Aortic and bilateral iliofemoral calcific atherosclerosis. 3. Lower lumbar spine mild spondylosis. Electronically Signed   By: Mitzi Hansen M.D.   On: 07/08/2016 20:26   Initial Impression / Assessment and Plan / ED Course  I have reviewed the triage vital  signs and the nursing notes.  Pertinent labs & imaging results that were available during my care of the patient were reviewed by me and considered in my medical decision making (see chart for details).     8:35 PM pain transiently improved with intravenous morphine. Requesting more pain medicine. Tylenol ordered. Plan prescription tramadol. Blood pressure recheck one week. Keep scheduled appointment with PMD Dr Aundria Rud next week [facility] Controlled Substance reporting System queried  Final Clinical Impressions(s) / ED Diagnoses  Diagnosis#1 acute on chronic bilateral lower extremity pain Final diagnoses:  None  #2Elevated blood pressure  New Prescriptions New Prescriptions   No medications on file     Doug Sou, MD 07/08/16 2040    Doug Sou, MD 07/08/16 2043

## 2016-07-08 NOTE — ED Triage Notes (Signed)
Pt sts bilateral leg and foot pain chronic in nature but worse today with hx of lupus

## 2016-07-08 NOTE — Discharge Instructions (Signed)
Keep your scheduled plan with Dr.Ramachandron next week. Ask him to repeat your blood pressure. Today's was elevated at 162/85

## 2016-07-08 NOTE — ED Notes (Signed)
Patient transported to CT 

## 2016-07-08 NOTE — ED Notes (Signed)
Dr Felix Pacini aware of d/c vital signs.  Wants manual BP done, pt will f/u with PCP for recheck.

## 2018-03-04 DIAGNOSIS — L930 Discoid lupus erythematosus: Secondary | ICD-10-CM | POA: Insufficient documentation

## 2018-06-02 DIAGNOSIS — J45909 Unspecified asthma, uncomplicated: Secondary | ICD-10-CM | POA: Insufficient documentation

## 2020-03-24 HISTORY — PX: BREAST BIOPSY: SHX20

## 2022-10-06 ENCOUNTER — Ambulatory Visit
Admission: RE | Admit: 2022-10-06 | Discharge: 2022-10-06 | Disposition: A | Discharge status: Home / Self Care | Payer: Medicare HMO | Source: Ambulatory Visit | Attending: Nurse Practitioner | Admitting: Nurse Practitioner

## 2022-10-06 ENCOUNTER — Other Ambulatory Visit: Payer: Self-pay | Admitting: Nurse Practitioner

## 2022-10-06 DIAGNOSIS — G5601 Carpal tunnel syndrome, right upper limb: Secondary | ICD-10-CM

## 2022-11-12 ENCOUNTER — Other Ambulatory Visit: Payer: Self-pay | Admitting: Nurse Practitioner

## 2022-11-12 DIAGNOSIS — Z1231 Encounter for screening mammogram for malignant neoplasm of breast: Secondary | ICD-10-CM

## 2022-11-21 ENCOUNTER — Ambulatory Visit: Payer: Medicare HMO | Admitting: Physician Assistant

## 2022-11-21 DIAGNOSIS — R202 Paresthesia of skin: Secondary | ICD-10-CM

## 2022-11-21 DIAGNOSIS — M654 Radial styloid tenosynovitis [de Quervain]: Secondary | ICD-10-CM

## 2022-11-21 NOTE — Progress Notes (Signed)
Office Visit Note   Patient: Courtney Payne           Date of Birth: 02/16/1966           MRN: [id number] Visit Date: 11/21/2022              Requested by: Karenann Cai, NP [address] [place],  [place] [address] PCP: Karenann Cai, NP   Assessment & Plan: Visit Diagnoses:  1. Right hand paresthesia   2. Tenosynovitis, de Quervain     Plan: Impression is right hand de Quervain's tenosynovitis and likely carpal tunnel syndrome.  In regards to the de Quervain's, I have discussed various treatment options to include thumb spica splint, topical Voltaren, cortisone injection.  She would like to first try topical anti-inflammatories and a thumb spica splint.  She is not interested in injections at all.  In regards to the right hand paresthesias, we have made a referral to Dr. Alvester Morin for nerve conduction study/EMG.  She will follow-up with [place] once that has been completed.  Call with concerns or questions in the meantime.  Follow-Up Instructions: Return for f/u after ncs/emg.   Orders:  Orders Placed This Encounter  Procedures   Ambulatory referral to Physical Medicine Rehab   No orders of the defined types were placed in this encounter.     Procedures: No procedures performed   Clinical Data: No additional findings.   Subjective: Chief Complaint  Patient presents with   Right Hand - Pain    HPI patient is a pleasant 57 year old right-hand-dominant female who comes in today with right hand pain and paresthesias for the past 3 weeks.  She denies any injury or change in activity.  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  The pain she is having is primarily to the first dorsal compartment and into the radial styloid.  Symptoms are worse with certain movements of her thumb.  She has been taking Tylenol without significant relief.  She also notes paresthesias to the thumb, index and long fingers.  She often wakes up shaking her hands at night.  She has  been taking gabapentin without relief.  She has not tried night splinting.  No previous nerve conduction study or cortisone injections to the right hand.  Review of Systems as detailed in HPI.  All others reviewed and are negative.   Objective: Vital Signs: LMP 11/23/2011   Physical Exam well-developed well-nourished female in no acute distress.  Alert and oriented x 3.  Ortho Exam right hand exam: She has moderate tenderness over the radial styloid.  Markedly positive Finkelstein test.  No pain or crepitus with grind test of the first [facility] joint.  Positive Phalen positive Tinel at the wrist.  No thenar atrophy.  She is neurovascularly intact distally.  Specialty Comments:  No specialty comments available.  Imaging: No new imaging   PMFS History: There are no problems to display for this patient.  Past Medical History:  Diagnosis Date   Arthritis    Asthma    Diabetes mellitus    Hypertension    Lupus (HCC)     No family history on file.  Past Surgical History:  Procedure Laterality Date   FOOT SURGERY  01/27/11   right foot for ganglion cyst   Social History   Occupational History   Not on file  Tobacco Use   Smoking status: Every Day    Current packs/day: 0.33    Types: Cigarettes  Smokeless tobacco: Never  Substance and Sexual Activity   Alcohol use: No   Drug use: No   Sexual activity: Yes

## 2022-12-05 ENCOUNTER — Telehealth: Payer: Self-pay | Admitting: Physical Medicine and Rehabilitation

## 2022-12-05 NOTE — Telephone Encounter (Deleted)
RUE NCS/EMG

## 2022-12-05 NOTE — Telephone Encounter (Signed)
Spoke with patient and scheduled NCV for 12/10/22.

## 2022-12-05 NOTE — Telephone Encounter (Signed)
Patient called returning your call. CB#[phone]

## 2022-12-10 ENCOUNTER — Other Ambulatory Visit: Payer: Self-pay | Admitting: Physical Medicine and Rehabilitation

## 2022-12-10 ENCOUNTER — Encounter: Payer: Self-pay | Admitting: Physical Medicine and Rehabilitation

## 2022-12-10 ENCOUNTER — Ambulatory Visit (INDEPENDENT_AMBULATORY_CARE_PROVIDER_SITE_OTHER): Payer: Medicare HMO | Admitting: Physical Medicine and Rehabilitation

## 2022-12-10 DIAGNOSIS — M79641 Pain in right hand: Secondary | ICD-10-CM | POA: Diagnosis not present

## 2022-12-10 DIAGNOSIS — R202 Paresthesia of skin: Secondary | ICD-10-CM

## 2022-12-10 DIAGNOSIS — M542 Cervicalgia: Secondary | ICD-10-CM | POA: Diagnosis not present

## 2022-12-10 NOTE — Progress Notes (Signed)
Courtney Payne - 57 y.o. female MRN [id number]  Date of birth: 01/02/1966  Office Visit Note: Visit Date: 12/10/2022 PCP: Karenann Cai, NP Referred by: Tarry Kos, MD  Subjective: Chief Complaint  Patient presents with   Right Wrist - Pain   HPI: Courtney Payne is a 57 y.o. female who comes in today at the request of Dr. Glee Arvin and Tessa Lerner, PA-C for evaluation and management of chronic, worsening and severe pain, numbness and tingling in the Right upper extremities.  Patient is Right hand dominant with right hand pain and paresthesias for the past 4 weeks.  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  Most of the pain is primarily in the first dorsal area around the thumb but also on the top of the wrist.  She gets a lot of pain with movement of the thumb.  She also wakes up at night with paresthesias and tingling and numbness.  This can also occur with certain positions during the day.  She has not done any splinting but has had medications including Tylenol and gabapentin without relief.  No history of injection treatment.  Does endorse some neck pain but is not having any pain down the arms.  She does carry a diagnosis of diabetes but I could not find any recent hemoglobin A1c values.  She also has lupus and multiple joint pains.   I spent more than 30 minutes speaking face-to-face with the patient with 50% of the time in counseling and discussing coordination of care.       ROS Otherwise per HPI.  Assessment & Plan: Visit Diagnoses:    ICD-10-CM   1. Paresthesia of skin  R20.2 NCV with EMG (electromyography)    2. Pain in right hand  M79.641     3. Cervicalgia  M54.2        Plan: Impression: She has symptoms consistent with arthritic change as well as possible carpal tunnel syndrome versus neuropathy.  Electrodiagnostic study performed.  The above electrodiagnostic study is ABNORMAL and reveals  evidence of a moderate right median nerve entrapment at the wrist (carpal tunnel syndrome) affecting sensory and motor components.   **There is suggestion of underlying peripheral neuropathy.   There is no significant electrodiagnostic evidence of any other focal nerve entrapment, brachial plexopathy or cervical radiculopathy.   Recommendations: 1.  Follow-up with referring physician. 2.  Continue current management of symptoms. 3.  Continue use of resting splint at night-time and as needed during the day. 4.  Suggest surgical evaluation.  Meds & Orders: No orders of the defined types were placed in this encounter.   Orders Placed This Encounter  Procedures   NCV with EMG (electromyography)    Follow-up: Return for  Glee Arvin, MD.   Procedures: No procedures performed  EMG & NCV Findings: Evaluation of the right median motor nerve showed prolonged distal onset latency (5.8 ms) and decreased conduction velocity (Elbow-Wrist, 47 m/s).  The right ulnar motor nerve showed reduced amplitude (2.9 mV), decreased conduction velocity (B Elbow-Wrist, 50 m/s), and decreased conduction velocity (A Elbow-B Elbow, 50 m/s).  The right median (across palm) sensory nerve showed no response (Palm) and prolonged distal peak latency (4.8 ms).  The right ulnar sensory nerve showed reduced amplitude (14.4 V).  All remaining nerves (as indicated in the following tables) were within normal limits.    All examined muscles (as indicated in  the following table) showed no evidence of electrical instability.    Impression: The above electrodiagnostic study is ABNORMAL and reveals evidence of a moderate right median nerve entrapment at the wrist (carpal tunnel syndrome) affecting sensory and motor components.   **There is suggestion of underlying peripheral neuropathy.   There is no significant electrodiagnostic evidence of any other focal nerve entrapment, brachial plexopathy or cervical radiculopathy.    Recommendations: 1.  Follow-up with referring physician. 2.  Continue current management of symptoms. 3.  Continue use of resting splint at night-time and as needed during the day. 4.  Suggest surgical evaluation.  ___________________________ Naaman Plummer FAAPMR Board Certified, American Board of Physical Medicine and Rehabilitation    Nerve Conduction Studies Anti Sensory Summary Table   Stim Site NR Peak (ms) Norm Peak (ms) P-T Amp (V) Norm P-T Amp Site1 Site2 Delta-P (ms) Dist (cm) Vel (m/s) Norm Vel (m/s)  Right Median Acr Palm Anti Sensory (2nd Digit)  30.5C  Wrist    *4.8 <3.6 21.5 >10 Wrist Palm  0.0    Palm *NR  <2.0          Right Radial Anti Sensory (Base 1st Digit)  31.1C  Wrist    2.3 <3.1 14.7  Wrist Base 1st Digit 2.3 0.0    Right Ulnar Anti Sensory (5th Digit)  31.2C  Wrist    3.6 <3.7 *14.4 >15.0 Wrist 5th Digit 3.6 14.0 39 >38   Motor Summary Table   Stim Site NR Onset (ms) Norm Onset (ms) O-P Amp (mV) Norm O-P Amp Site1 Site2 Delta-0 (ms) Dist (cm) Vel (m/s) Norm Vel (m/s)  Right Median Motor (Abd Poll Brev)  31C  Wrist    *5.8 <4.2 5.6 >5 Elbow Wrist 5.1 24.0 *47 >50  Elbow    10.9  4.7         Right Ulnar Motor (Abd Dig Min)  31.6C  Wrist    3.8 <4.2 *2.9 >3 B Elbow Wrist 4.6 23.0 *50 >53  B Elbow    8.4  6.0  A Elbow B Elbow 2.4 12.0 *50 >53  A Elbow    10.8  6.0          EMG   Side Muscle Nerve Root Ins Act Fibs Psw Amp Dur Poly Recrt Int Dennie Bible Comment  Right Abd Poll Brev Median C8-T1 Nml Nml Nml Nml Nml 0 Nml Nml   Right 1stDorInt Ulnar C8-T1 Nml Nml Nml Nml Nml 0 Nml Nml   Right PronatorTeres Median C6-7 Nml Nml Nml Nml Nml 0 Nml Nml   Right Biceps Musculocut C5-6 Nml Nml Nml Nml Nml 0 Nml Nml   Right Deltoid Axillary C5-6 Nml Nml Nml Nml Nml 0 Nml Nml     Nerve Conduction Studies Anti Sensory Left/Right Comparison   Stim Site L Lat (ms) R Lat (ms) L-R Lat (ms) L Amp (V) R Amp (V) L-R Amp (%) Site1 Site2 L Vel (m/s) R Vel (m/s) L-R Vel  (m/s)  Median Acr Palm Anti Sensory (2nd Digit)  30.5C  Wrist  *4.8   21.5  Wrist Palm     Palm             Radial Anti Sensory (Base 1st Digit)  31.1C  Wrist  2.3   14.7  Wrist Base 1st Digit     Ulnar Anti Sensory (5th Digit)  31.2C  Wrist  3.6   *14.4  Wrist 5th Digit  39    Motor Left/Right Comparison  Stim Site L Lat (ms) R Lat (ms) L-R Lat (ms) L Amp (mV) R Amp (mV) L-R Amp (%) Site1 Site2 L Vel (m/s) R Vel (m/s) L-R Vel (m/s)  Median Motor (Abd Poll Brev)  31C  Wrist  *5.8   5.6  Elbow Wrist  *47   Elbow  10.9   4.7        Ulnar Motor (Abd Dig Min)  31.6C  Wrist  3.8   *2.9  B Elbow Wrist  *50   B Elbow  8.4   6.0  A Elbow B Elbow  *50   A Elbow  10.8   6.0           Waveforms:             Clinical History: No specialty comments available.   She reports that she has been smoking cigarettes. She has never used smokeless tobacco. No results for input(s): "HGBA1C", "LABURIC" in the last 8760 hours.  Objective:  VS:  HT:    WT:   BMI:     BP:   HR: bpm  TEMP: ( )  RESP:  Physical Exam Vitals and nursing note reviewed.  Constitutional:      General: She is not in acute distress.    Appearance: Normal appearance. She is well-developed. She is not ill-appearing.  HENT:     Head: Normocephalic and atraumatic.  Eyes:     Conjunctiva/sclera: Conjunctivae normal.     Pupils: Pupils are equal, round, and reactive to light.  Cardiovascular:     Rate and Rhythm: Normal rate.     Pulses: Normal pulses.  Pulmonary:     Effort: Pulmonary effort is normal.  Musculoskeletal:        General: No swelling, tenderness or deformity.     Right lower leg: No edema.     Left lower leg: No edema.     Comments: Inspection reveals no atrophy of the bilateral APB or FDI or hand intrinsics. There is no swelling, color changes, allodynia or dystrophic changes. There is 5 out of 5 strength in the bilateral wrist extension, finger abduction and long finger flexion. There is  intact sensation to light touch in all dermatomal and peripheral nerve distributions. There is a negative Tinel's test at the bilateral wrist and elbow. There is a positive Phalen's test on the right. There is a negative Hoffmann's test bilaterally.  Skin:    General: Skin is warm and dry.     Findings: No erythema or rash.  Neurological:     General: No focal deficit present.     Mental Status: She is alert and oriented to person, place, and time.     Sensory: No sensory deficit.     Motor: No weakness or abnormal muscle tone.     Coordination: Coordination normal.     Gait: Gait normal.  Psychiatric:        Mood and Affect: Mood normal.        Behavior: Behavior normal.     Ortho Exam  Imaging: No results found.  Past Medical/Family/Surgical/Social History: Medications & Allergies reviewed per EMR, new medications updated. 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 No pertinent family history. Past Surgical History:  Procedure Laterality Date   FOOT SURGERY  01/27/11   right foot for ganglion cyst   Social History   Occupational History   Not on file  Tobacco Use   Smoking status: Every Day    Current packs/day: 0.33    Types: Cigarettes   Smokeless tobacco: Never  Substance and Sexual Activity   Alcohol use: No   Drug use: No   Sexual activity: Yes

## 2022-12-10 NOTE — Progress Notes (Signed)
Functional Pain Scale - descriptive words and definitions  Moderate (4)   Constantly aware of pain, can complete ADLs with modification/sleep marginally affected at times/passive distraction is of no use, but active distraction gives some relief. Moderate range order Right wrist pain. Tips of 3rd and 4th fongers go numb. Hurts to flex wrist.  Average Pain 3

## 2022-12-15 ENCOUNTER — Encounter: Payer: Self-pay | Admitting: Physical Medicine and Rehabilitation

## 2022-12-15 NOTE — Procedures (Signed)
EMG & NCV Findings: Evaluation of the right median motor nerve showed prolonged distal onset latency (5.8 ms) and decreased conduction velocity (Elbow-Wrist, 47 m/s).  The right ulnar motor nerve showed reduced amplitude (2.9 mV), decreased conduction velocity (B Elbow-Wrist, 50 m/s), and decreased conduction velocity (A Elbow-B Elbow, 50 m/s).  The right median (across palm) sensory nerve showed no response (Palm) and prolonged distal peak latency (4.8 ms).  The right ulnar sensory nerve showed reduced amplitude (14.4 V).  All remaining nerves (as indicated in the following tables) were within normal limits.    All examined muscles (as indicated in the following table) showed no evidence of electrical instability.    Impression: The above electrodiagnostic study is ABNORMAL and reveals evidence of a moderate right median nerve entrapment at the wrist (carpal tunnel syndrome) affecting sensory and motor components.   **There is suggestion of underlying peripheral neuropathy.   There is no significant electrodiagnostic evidence of any other focal nerve entrapment, brachial plexopathy or cervical radiculopathy.   Recommendations: 1.  Follow-up with referring physician. 2.  Continue current management of symptoms. 3.  Continue use of resting splint at night-time and as needed during the day. 4.  Suggest surgical evaluation.  ___________________________ Naaman Plummer FAAPMR Board Certified, American Board of Physical Medicine and Rehabilitation    Nerve Conduction Studies Anti Sensory Summary Table   Stim Site NR Peak (ms) Norm Peak (ms) P-T Amp (V) Norm P-T Amp Site1 Site2 Delta-P (ms) Dist (cm) Vel (m/s) Norm Vel (m/s)  Right Median Acr Palm Anti Sensory (2nd Digit)  30.5C  Wrist    *4.8 <3.6 21.5 >10 Wrist Palm  0.0    Palm *NR  <2.0          Right Radial Anti Sensory (Base 1st Digit)  31.1C  Wrist    2.3 <3.1 14.7  Wrist Base 1st Digit 2.3 0.0    Right Ulnar Anti Sensory (5th  Digit)  31.2C  Wrist    3.6 <3.7 *14.4 >15.0 Wrist 5th Digit 3.6 14.0 39 >38   Motor Summary Table   Stim Site NR Onset (ms) Norm Onset (ms) O-P Amp (mV) Norm O-P Amp Site1 Site2 Delta-0 (ms) Dist (cm) Vel (m/s) Norm Vel (m/s)  Right Median Motor (Abd Poll Brev)  31C  Wrist    *5.8 <4.2 5.6 >5 Elbow Wrist 5.1 24.0 *47 >50  Elbow    10.9  4.7         Right Ulnar Motor (Abd Dig Min)  31.6C  Wrist    3.8 <4.2 *2.9 >3 B Elbow Wrist 4.6 23.0 *50 >53  B Elbow    8.4  6.0  A Elbow B Elbow 2.4 12.0 *50 >53  A Elbow    10.8  6.0          EMG   Side Muscle Nerve Root Ins Act Fibs Psw Amp Dur Poly Recrt Int Dennie Bible Comment  Right Abd Poll Brev Median C8-T1 Nml Nml Nml Nml Nml 0 Nml Nml   Right 1stDorInt Ulnar C8-T1 Nml Nml Nml Nml Nml 0 Nml Nml   Right PronatorTeres Median C6-7 Nml Nml Nml Nml Nml 0 Nml Nml   Right Biceps Musculocut C5-6 Nml Nml Nml Nml Nml 0 Nml Nml   Right Deltoid Axillary C5-6 Nml Nml Nml Nml Nml 0 Nml Nml     Nerve Conduction Studies Anti Sensory Left/Right Comparison   Stim Site L Lat (ms) R Lat (ms) L-R Lat (ms) L Amp (V)  R Amp (V) L-R Amp (%) Site1 Site2 L Vel (m/s) R Vel (m/s) L-R Vel (m/s)  Median Acr Palm Anti Sensory (2nd Digit)  30.5C  Wrist  *4.8   21.5  Wrist Palm     Palm             Radial Anti Sensory (Base 1st Digit)  31.1C  Wrist  2.3   14.7  Wrist Base 1st Digit     Ulnar Anti Sensory (5th Digit)  31.2C  Wrist  3.6   *14.4  Wrist 5th Digit  39    Motor Left/Right Comparison   Stim Site L Lat (ms) R Lat (ms) L-R Lat (ms) L Amp (mV) R Amp (mV) L-R Amp (%) Site1 Site2 L Vel (m/s) R Vel (m/s) L-R Vel (m/s)  Median Motor (Abd Poll Brev)  31C  Wrist  *5.8   5.6  Elbow Wrist  *47   Elbow  10.9   4.7        Ulnar Motor (Abd Dig Min)  31.6C  Wrist  3.8   *2.9  B Elbow Wrist  *50   B Elbow  8.4   6.0  A Elbow B Elbow  *50   A Elbow  10.8   6.0           Waveforms:

## 2022-12-22 ENCOUNTER — Ambulatory Visit (INDEPENDENT_AMBULATORY_CARE_PROVIDER_SITE_OTHER): Payer: Medicare HMO | Admitting: Primary Care

## 2022-12-22 ENCOUNTER — Encounter: Payer: Self-pay | Admitting: Primary Care

## 2022-12-22 VITALS — BP 110/80 | HR 87 | Ht 65.5 in | Wt 233.4 lb

## 2022-12-22 DIAGNOSIS — G4733 Obstructive sleep apnea (adult) (pediatric): Secondary | ICD-10-CM | POA: Diagnosis not present

## 2022-12-22 NOTE — Patient Instructions (Addendum)
Recommend repeating sleep study since you have lost weight and there was a break in therapy We may be able to get you a new CPAP machine after completing sleep study depending on new insurance and/or if machine is >5 years  Orders: Home sleep study re: loud snoring (ordered)   Follow-up: 3 months with Beth [facility] follow-up   Sleep Apnea  Sleep apnea is a condition that affects your breathing while you are sleeping. Your tongue or soft tissue in your throat may block the flow of air while you sleep. You may have shallow breathing or stop breathing for short periods of time. People with sleep apnea may snore loudly. There are three kinds of sleep apnea: Obstructive sleep apnea. This kind is caused by a blocked or collapsed airway. This is the most common. Central sleep apnea. This kind happens when the part of the brain that controls breathing does not send the correct signals to the muscles that control breathing. Mixed sleep apnea. This is a combination of obstructive and central sleep apnea. What are the causes? The most common cause of sleep apnea is a collapsed or blocked airway. What increases the risk? Being very overweight. Having family members with sleep apnea. Having a tongue or tonsils that are larger than normal. Having a small airway or jaw problems. Being older. What are the signs or symptoms? Loud snoring. Restless sleep. Trouble staying asleep. Being sleepy or tired during the day. Waking up gasping or choking. Having a headache in the morning. Mood swings. Having a hard time remembering things and concentrating. How is this diagnosed? A medical history. A physical exam. A sleep study. This is also called a polysomnography test. This test is done at a sleep lab or in your home while you are sleeping. How is this treated? Treatment may include: Sleeping on your side. Losing weight if you're overweight. Wearing an oral appliance. This is a mouthpiece that moves  your lower jaw forward. Using a positive airway pressure (PAP) device to keep your airways open while you sleep, such as: A continuous positive airway pressure (CPAP) device. This device gives forced air through a mask when you breathe out. This keeps your airways open. A bilevel positive airway pressure (BIPAP) device. This device gives forced air through a mask when you breathe in and when you breathe out to keep your airways open. Having surgery if other treatments do not work. If your sleep apnea is not treated, you may be at risk for: Heart failure. Heart attack. Stroke. Type 2 diabetes or a problem with your blood sugar called insulin resistance. Follow these instructions at home: Medicines Take your medicines only as told by your health care provider. Avoid alcohol, medicines to help you relax, and certain pain medicines. These may make sleep apnea worse. General instructions Do not smoke, vape, or use products with nicotine or tobacco in them. If you need help quitting, talk with your provider. If you were given a PAP device to open your airway while you sleep, use it as told by your provider. If you're having surgery, make sure to tell your provider you have sleep apnea. You may need to bring your PAP device with you. Contact a health care provider if: The PAP device that you were given to use during sleep bothers you or does not seem to be working. You do not feel better or you feel worse. Get help right away if: You have trouble breathing. You have chest pain. You have trouble talking.  One side of your body feels weak. A part of your face is hanging down. These symptoms may be an emergency. Call 911 right away. Do not wait to see if the symptoms will go away. Do not drive yourself to the hospital. This information is not intended to replace advice given to you by your health care provider. Make sure you discuss any questions you have with your health care provider. Document  Revised: 05/15/2022 Document Reviewed: 05/15/2022 Elsevier Patient Education  2024 [employer].

## 2022-12-22 NOTE — Assessment & Plan Note (Signed)
-   Chronic dyspnea symptoms. Not acutely exacerbated. Uses SABA 2-3 times a week. Maintained on Trelegy one puff daily. Rx managed by PCP.

## 2022-12-22 NOTE — Progress Notes (Signed)
@Patient  ID: Courtney Payne, female    DOB: [birth year], 57 y.o.   MRN: [id number]  Chief Complaint  Patient presents with   Consult    Referring provider: Karenann Cai, NP  HPI: 57 year old female, current everyday smoker.  Past medical history significant for hypertension, asthma, type 2 diabetes, discoid lupus.  12/22/2022 Patient presents today for sleep consult. She was followed by pulmonary with [facility] for hx asthma and moderate OSA. She was last seen by them in July 2021.   She comes in to our clinic today for sleep consult. Last sleep study 05/2018 showed mild OSA with AHI of 13.2/hour. She reports seeing a slight improvement in sleep quality and energy level with PAP therapy. She has not worn CPAP in about 2 years, she had a hard time tolerating due to anxiety. She did better with nasal mask. She has an airsense 10 machine which is about 58-88 years old. Download not avaialble, pressure settings 5-20cm h20. She reports 20-30lbs weight loss since original sleep study. Unsure if she snores. She very occasionally with wake herself up snoring/gasping/choking. Epworth 6/24. No concern for narcolepsy, cataplexy or sleep walking.   Chronic dyspnea symptoms. She takes Trelgy . She uses Albuterol 2-3 times a week on average. Heat/humidity will flare up her symptoms. She is on Plaquenil two times daily, awaiting an appointment with rheumatology.   Sleep questionnaire Symptoms-   unsure  Prior sleep study- 2021 with [facility] in [place]  Bedtime- 11pm-1am Time to fall asleep- 1-2 hours  Nocturnal awakenings- 1-2 times  Out of bed/start of day- 7:30-8:30am  Weight changes- 30 lbs  Do you operate heavy machinery- no Do you currently wear CPAP- unsure  Do you current wear oxygen- no Epworth- 6  Testing: Pulmonary Function Testing (PFTs): Spirometry Lab Results  Lab Units 03/12/18 0910  FVCPRE Liters 3.17  FVCPREPRED % 104   FVCPOST Liters 3.33  FVCPOSTPRED % 109  FVCCHNG % 5  FEV1PRE Liters 2.39  FEV1PREPRED % 98  FEV1POST Liters 2.70  FEV1POSTPRED % 111  FEV1CHNG % 13  FEV1FVCPRE % 76  FEV1FVCPOST % 81   Lab Results  Lab Units 01/01/18 0000  CLASS DESCRIPTION Comment  D001-IGE D PTERONYSSINUS kU/L 0.44*  D002-IGE D FARINAE kU/L 0.42*  E001-IGE CAT DANDER kU/L <0.10  G002-IGE [place] GRASS kU/L <0.10  M001-IGE PENICILLIUM CHRYSOGEN kU/L <0.10  M002-IGE CLADOSPORIUM HERBARUM kU/L <0.10  M006-IGE ALTERNARIA ALTERNATA kU/L <0.10  T003-IGE COMMON SILVER BIRCH kU/L <0.10  T007-IGE OAK, WHITE kU/L <0.10  T008-IGE ELM, AMERICAN kU/L <0.10  T015-IGE ASH, WHITE kU/L <0.10  T001-IGE MAPLE/BOX ELDER kU/L <0.10        Allergies  Allergen Reactions   Penicillins Hives   Tape Rash    Due to lupus    Immunization History  Administered Date(s) Administered   Influenza,inj,Quad PF,6+ Mos 12/16/2017, 12/01/2018   Pneumococcal Polysaccharide-23 12/01/2018    Past Medical History:  Diagnosis Date   Arthritis    Asthma    Diabetes mellitus    Hypertension    Lupus (HCC)     Tobacco History: Social History   Tobacco Use  Smoking Status Former   Current packs/day: 0.33   Types: Cigarettes  Smokeless Tobacco Never  Tobacco Comments   Quit over a year ago   Counseling given: Not Answered Tobacco comments: Quit over a year ago   Outpatient Medications Prior to Visit  Medication Sig Dispense Refill   acetaminophen (TYLENOL) 500  MG tablet Take 650 mg by mouth every 8 (eight) hours as needed.     albuterol (PROVENTIL HFA;VENTOLIN HFA) 108 (90 BASE) MCG/ACT inhaler Inhale 2 puffs into the lungs every 6 (six) hours as needed. For shortness of breath     amLODipine (NORVASC) 5 MG tablet Take 5 mg by mouth daily.     ammonium lactate (LAC-HYDRIN) 12 % lotion apply to feet twice a day     aspirin EC 81 MG tablet Take 81 mg by mouth daily.       atorvastatin (LIPITOR) 10 MG tablet Take 10 mg  by mouth daily.     buPROPion (WELLBUTRIN XL) 300 MG 24 hr tablet Take 300 mg by mouth daily.     Cholecalciferol 250 MCG (10000 UT) CAPS Take by mouth.     cyclobenzaprine (FLEXERIL) 5 MG tablet Take 5 mg by mouth 2 (two) times daily.     DULoxetine (CYMBALTA) 60 MG capsule Take 60 mg by mouth daily.     gabapentin (NEURONTIN) 300 MG capsule Take 300 mg by mouth 3 (three) times daily.     hydroxychloroquine (PLAQUENIL) 200 MG tablet Take 200 mg by mouth 2 (two) times daily.     metFORMIN (GLUCOPHAGE) 1000 MG tablet Take 1,000 mg by mouth 2 (two) times daily with a meal.       omeprazole (PRILOSEC) 20 MG capsule Take 20 mg by mouth daily.     OZEMPIC, 0.25 OR 0.5 MG/DOSE, 2 MG/3ML SOPN Inject into the skin.     traZODone (DESYREL) 50 MG tablet Take 50 mg by mouth at bedtime as needed for sleep. 1/2 tablet prn     TRELEGY ELLIPTA 100-62.5-25 MCG/ACT AEPB Inhale 1 puff into the lungs daily.     triamcinolone cream (KENALOG) 0.1 % Apply 1 application topically 2 (two) times daily.     ferrous sulfate 325 (65 FE) MG EC tablet Take by mouth.     Diclofenac Potassium 50 MG PACK Take 50 mg by mouth. (Patient not taking: Reported on 12/22/2022)     glipiZIDE (GLUCOTROL) 10 MG tablet Take 10 mg by mouth daily.   (Patient not taking: Reported on 12/22/2022)     HYDROcodone-acetaminophen (NORCO/VICODIN) 5-325 MG tablet Take 1 tablet by mouth every 4 (four) hours as needed. (Patient not taking: Reported on 12/22/2022) 15 tablet 0   ibuprofen (ADVIL,MOTRIN) 800 MG tablet Take 800 mg by mouth 2 (two) times daily as needed. For pain  (Patient not taking: Reported on 12/22/2022)     ibuprofen (ADVIL,MOTRIN) 800 MG tablet Take 1 tablet (800 mg total) by mouth 3 (three) times daily. (Patient not taking: Reported on 12/22/2022) 21 tablet 0   predniSONE (DELTASONE) 50 MG tablet 1 tab po daily for 6 days. Take with food. (Patient not taking: Reported on 12/22/2022) 6 tablet 0   simvastatin (ZOCOR) 10 MG tablet Take 10 mg  by mouth at bedtime.   (Patient not taking: Reported on 12/22/2022)     traMADol (ULTRAM) 50 MG tablet Take 1 tablet (50 mg total) by mouth every 6 (six) hours as needed. (Patient not taking: Reported on 12/22/2022) 20 tablet 0   No facility-administered medications prior to visit.    Review of Systems  Review of Systems  Constitutional:  Positive for fatigue.  HENT: Negative.    Respiratory:  Positive for shortness of breath.   Psychiatric/Behavioral:  Positive for sleep disturbance.     Physical Exam  BP 110/80 (BP Location: Right Arm, Cuff  Size: Large)   Pulse 87   Ht 5' 5.5" (1.664 m)   Wt 233 lb 6.4 oz (105.9 kg)   LMP 11/23/2011   SpO2 98%   BMI 38.25 kg/m  Physical Exam Constitutional:      Appearance: Normal appearance.  HENT:     Head: Normocephalic and atraumatic.  Cardiovascular:     Rate and Rhythm: Normal rate and regular rhythm.  Pulmonary:     Effort: Pulmonary effort is normal.     Breath sounds: Normal breath sounds.  Musculoskeletal:        General: Normal range of motion.  Skin:    General: Skin is warm and dry.  Neurological:     General: No focal deficit present.     Mental Status: She is alert and oriented to person, place, and time. Mental status is at baseline.  Psychiatric:        Mood and Affect: Mood normal.        Behavior: Behavior normal.        Thought Content: Thought content normal.        Judgment: Judgment normal.      Lab Results:  CBC    Component Value Date/Time   WBC 9.0 07/08/2016 1801   RBC 4.60 07/08/2016 1801   HGB 13.8 07/08/2016 1801   HCT 40.4 07/08/2016 1801   PLT 197 07/08/2016 1801   MCV 87.8 07/08/2016 1801   MCH 30.0 07/08/2016 1801   MCHC 34.2 07/08/2016 1801   RDW 13.5 07/08/2016 1801   LYMPHSABS 4.3 (H) 07/08/2016 1801   MONOABS 0.5 07/08/2016 1801   EOSABS 0.2 07/08/2016 1801   BASOSABS 0.1 07/08/2016 1801    BMET    Component Value Date/Time   NA 139 07/08/2016 1801   K 4.3 07/08/2016 1801    CL 104 07/08/2016 1801   CO2 26 07/08/2016 1801   GLUCOSE 94 07/08/2016 1801   BUN 21 (H) 07/08/2016 1801   CREATININE 1.11 (H) 07/08/2016 1801   CALCIUM 9.9 07/08/2016 1801   GFRNONAA 56 (L) 07/08/2016 1801   GFRAA >60 07/08/2016 1801    BNP No results found for: "BNP"  ProBNP No results found for: "PROBNP"  Imaging: NCV with EMG (electromyography)  Result Date: 12/10/2022 Tyrell Antonio, MD     12/15/2022  4:59 AM EMG & NCV Findings: Evaluation of the right median motor nerve showed prolonged distal onset latency (5.8 ms) and decreased conduction velocity (Elbow-Wrist, 47 m/s).  The right ulnar motor nerve showed reduced amplitude (2.9 mV), decreased conduction velocity (B Elbow-Wrist, 50 m/s), and decreased conduction velocity (A Elbow-B Elbow, 50 m/s).  The right median (across palm) sensory nerve showed no response (Palm) and prolonged distal peak latency (4.8 ms).  The right ulnar sensory nerve showed reduced amplitude (14.4 V).  All remaining nerves (as indicated in the following tables) were within normal limits.  All examined muscles (as indicated in the following table) showed no evidence of electrical instability.  Impression: The above electrodiagnostic study is ABNORMAL and reveals evidence of a moderate right median nerve entrapment at the wrist (carpal tunnel syndrome) affecting sensory and motor components. **There is suggestion of underlying peripheral neuropathy. There is no significant electrodiagnostic evidence of any other focal nerve entrapment, brachial plexopathy or cervical radiculopathy. Recommendations: 1.  Follow-up with referring physician. 2.  Continue current management of symptoms. 3.  Continue use of resting splint at night-time and as needed during the day. 4.  Suggest surgical evaluation. ___________________________ Merlyn Albert  Sande Brothers Board Certified, American Board of Physical Medicine and Rehabilitation Nerve Conduction Studies Anti Sensory Summary Table   Stim Site NR Peak (ms) Norm Peak (ms) P-T Amp (V) Norm P-T Amp Site1 Site2 Delta-P (ms) Dist (cm) Vel (m/s) Norm Vel (m/s) Right Median Acr Palm Anti Sensory (2nd Digit)  30.5C Wrist    *4.8 <3.6 21.5 >10 Wrist Palm  0.0   Palm *NR  <2.0         Right Radial Anti Sensory (Base 1st Digit)  31.1C Wrist    2.3 <3.1 14.7  Wrist Base 1st Digit 2.3 0.0   Right Ulnar Anti Sensory (5th Digit)  31.2C Wrist    3.6 <3.7 *14.4 >15.0 Wrist 5th Digit 3.6 14.0 39 >38 Motor Summary Table  Stim Site NR Onset (ms) Norm Onset (ms) O-P Amp (mV) Norm O-P Amp Site1 Site2 Delta-0 (ms) Dist (cm) Vel (m/s) Norm Vel (m/s) Right Median Motor (Abd Poll Brev)  31C Wrist    *5.8 <4.2 5.6 >5 Elbow Wrist 5.1 24.0 *47 >50 Elbow    10.9  4.7        Right Ulnar Motor (Abd Dig Min)  31.6C Wrist    3.8 <4.2 *2.9 >3 B Elbow Wrist 4.6 23.0 *50 >53 B Elbow    8.4  6.0  A Elbow B Elbow 2.4 12.0 *50 >53 A Elbow    10.8  6.0        EMG  Side Muscle Nerve Root Ins Act Fibs Psw Amp Dur Poly Recrt Int Dennie Bible Comment Right Abd Poll Brev Median C8-T1 Nml Nml Nml Nml Nml 0 Nml Nml  Right 1stDorInt Ulnar C8-T1 Nml Nml Nml Nml Nml 0 Nml Nml  Right PronatorTeres Median C6-7 Nml Nml Nml Nml Nml 0 Nml Nml  Right Biceps Musculocut C5-6 Nml Nml Nml Nml Nml 0 Nml Nml  Right Deltoid Axillary C5-6 Nml Nml Nml Nml Nml 0 Nml Nml  Nerve Conduction Studies Anti Sensory Left/Right Comparison  Stim Site L Lat (ms) R Lat (ms) L-R Lat (ms) L Amp (V) R Amp (V) L-R Amp (%) Site1 Site2 L Vel (m/s) R Vel (m/s) L-R Vel (m/s) Median Acr Palm Anti Sensory (2nd Digit)  30.5C Wrist  *4.8   21.5  Wrist Palm    Palm            Radial Anti Sensory (Base 1st Digit)  31.1C Wrist  2.3   14.7  Wrist Base 1st Digit    Ulnar Anti Sensory (5th Digit)  31.2C Wrist  3.6   *14.4  Wrist 5th Digit  39  Motor Left/Right Comparison  Stim Site L Lat (ms) R Lat (ms) L-R Lat (ms) L Amp (mV) R Amp (mV) L-R Amp (%) Site1 Site2 L Vel (m/s) R Vel (m/s) L-R Vel (m/s) Median Motor (Abd Poll Brev)  31C Wrist   *5.8   5.6  Elbow Wrist  *47  Elbow  10.9   4.7       Ulnar Motor (Abd Dig Min)  31.6C Wrist  3.8   *2.9  B Elbow Wrist  *50  B Elbow  8.4   6.0  A Elbow B Elbow  *50  A Elbow  10.8   6.0       Waveforms:          Assessment & Plan:   Moderate obstructive sleep apnea - Hx moderate sleep apnea, previously followed by [facility] in [place].  Last sleep study 05/2018 showed mild  OSA with AHI of 13.2/hour. She stopped wearing CPAP 2 years ago, she had a difficulty tolerating due to anxiety but did better with nasal mask. She has lost 20-30lbs. Needs repeat HST due to break in therapy. She is open to re-trying CPAP if needed. Encourage weight loss and side sleeping position. FU in 3 months or sooner if needed.   Asthma - Chronic dyspnea symptoms. Not acutely exacerbated. Uses SABA 2-3 times a week. Maintained on Trelegy one puff daily. Rx managed by PCP.   Glenford Bayley, NP 12/22/2022

## 2022-12-22 NOTE — Assessment & Plan Note (Signed)
-   Hx moderate sleep apnea, previously followed by [facility] in [place].  Last sleep study 05/2018 showed mild OSA with AHI of 13.2/hour. She stopped wearing CPAP 2 years ago, she had a difficulty tolerating due to anxiety but did better with nasal mask. She has lost 20-30lbs. Needs repeat HST due to break in therapy. She is open to re-trying CPAP if needed. Encourage weight loss and side sleeping position. FU in 3 months or sooner if needed.

## 2023-01-21 ENCOUNTER — Ambulatory Visit: Payer: Medicare HMO | Admitting: Primary Care

## 2023-01-21 DIAGNOSIS — G4733 Obstructive sleep apnea (adult) (pediatric): Secondary | ICD-10-CM

## 2023-01-27 NOTE — Congregational Nurse Program (Unsigned)
  Dept: [phone]   Congregational Nurse Program Note  Date of Encounter: 01/27/2023    Past Medical History: Past Medical History:  Diagnosis Date   Arthritis    Asthma    Diabetes mellitus    Hypertension    Lupus [facility])     Encounter Details:  Community Questionnaire - 01/27/23 1416       Questionnaire   Ask client: Do you give verbal consent for me to treat you today? Yes    Student Assistance N/A    [place]    Encounter Setting CN site    Population Status Unknown   Has own apartment at [employer];Medicare    Insurance/Financial Assistance Referral N/A    Medication N/A    Medical Provider Yes    Screening Referrals Made N/A    Medical Referrals Made N/A    Medical Appointment Completed N/A    CNP Interventions Advocate/Support;Educate;Counsel    Screenings CN Performed Blood Pressure    ED Visit Averted N/A    Life-Saving Intervention Made N/A

## 2023-02-17 NOTE — Progress Notes (Signed)
Please let patient know home sleep study showed mild obstructive sleep apnea, patient had on average 9 apneic events per hour.  Please schedule OV to discuss treatment options.

## 2023-03-27 ENCOUNTER — Ambulatory Visit: Payer: Medicare HMO | Admitting: Primary Care

## 2023-04-14 ENCOUNTER — Ambulatory Visit
Admission: RE | Admit: 2023-04-14 | Discharge: 2023-04-14 | Disposition: A | Discharge status: Home / Self Care | Payer: 59 | Source: Ambulatory Visit | Attending: Nurse Practitioner | Admitting: Nurse Practitioner

## 2023-04-14 DIAGNOSIS — Z1231 Encounter for screening mammogram for malignant neoplasm of breast: Secondary | ICD-10-CM

## 2023-04-15 NOTE — Congregational Nurse Program (Signed)
  Dept: [phone]   Congregational Nurse Program Note  Date of Encounter:  04/14/2023  Clinic visit to discuss management of diabetes.  Stated that her blood glucose self-check results have been lower this past week and was better when she saw MD. Rosine Abe regarding normal blood glucose levels and need to stay within range to keep A1c level as close to normal as possible.  Past Medical History: Past Medical History:  Diagnosis Date   Arthritis    Asthma    Diabetes mellitus    Hypertension    Lupus     Encounter Details:  Community Questionnaire - 04/14/23 1345       Questionnaire   Ask client: Do you give verbal consent for me to treat you today? Yes    Student Assistance N/A    [place]    Encounter Setting CN site    Population Status Unknown   Has own apartment at [employer];Medicare    Insurance/Financial Assistance Referral N/A    Medication N/A    Medical Provider Yes    Screening Referrals Made N/A    Medical Referrals Made N/A    Medical Appointment Completed N/A    CNP Interventions Advocate/Support;Educate;Counsel    Screenings CN Performed N/A    ED Visit Averted N/A    Life-Saving Intervention Made N/A

## 2023-04-20 ENCOUNTER — Ambulatory Visit (INDEPENDENT_AMBULATORY_CARE_PROVIDER_SITE_OTHER): Payer: 59 | Admitting: Primary Care

## 2023-04-20 ENCOUNTER — Encounter: Payer: Self-pay | Admitting: Primary Care

## 2023-04-20 VITALS — BP 135/87 | HR 66 | Temp 98.2°F | Ht 66.0 in | Wt 241.8 lb

## 2023-04-20 DIAGNOSIS — G4733 Obstructive sleep apnea (adult) (pediatric): Secondary | ICD-10-CM | POA: Diagnosis not present

## 2023-04-20 DIAGNOSIS — J4540 Moderate persistent asthma, uncomplicated: Secondary | ICD-10-CM

## 2023-04-20 MED ORDER — ALBUTEROL SULFATE HFA 108 (90 BASE) MCG/ACT IN AERS: 2.0000 | INHALATION_SPRAY | Freq: Four times a day (QID) | RESPIRATORY_TRACT | 1 refills | Status: DC | PRN

## 2023-04-20 MED ORDER — ALBUTEROL SULFATE (2.5 MG/3ML) 0.083% IN NEBU: 2.5000 mg | INHALATION_SOLUTION | Freq: Four times a day (QID) | RESPIRATORY_TRACT | 1 refills | Status: AC | PRN

## 2023-04-20 MED ORDER — TRELEGY ELLIPTA 100-62.5-25 MCG/ACT IN AEPB: 1.0000 | INHALATION_SPRAY | Freq: Every day | RESPIRATORY_TRACT | 5 refills | Status: DC

## 2023-04-20 NOTE — Patient Instructions (Addendum)
 -  OBSTRUCTIVE SLEEP APNEA: Obstructive sleep apnea is a condition where your breathing stops and starts during sleep. Your recent sleep study shows mild sleep apnea with some improvement. We will restart CPAP therapy with a new machine set to auto settings of 5-12 cm H2O. Please use the CPAP machine as directed and we will check your progress in 2-3 months.  -ASTHMA: Asthma is a condition that causes your airways to narrow and swell, leading to wheezing and shortness of breath. You are currently using Trelegy and a rescue inhaler. We will refill your Trelegy and Albuterol inhaler. Additionally, we will order a chest x-ray due to your history of smoking and asthma, and provide a nebulizer for acute episodes.  -WEIGHT MANAGEMENT: Maintaining a healthy weight is important for overall health. You have lost weight but have not yet reached your goal. Continue your efforts to lose weight, aiming for your goal of 210 pounds.  INSTRUCTIONS: Please schedule a follow-up appointment in 2-3 months to check your compliance with CPAP therapy. Additionally, we have ordered a chest x-ray due to your smoking history and asthma. Continue your weight loss efforts and use the CPAP machine as directed.  Orders: DME order for nebulizer machine and CPAP machine (ordered)  RX: Refill Trelegy (sent) Albuterol HFA and nebulizer solution   Follow-up 2-3 months CPAP compliance visit (virtual ok)

## 2023-04-20 NOTE — Progress Notes (Signed)
@Patient  ID: Courtney Payne, female    DOB: 12-May-1965, 58 y.o.   MRN: [id number]  Chief Complaint  Patient presents with   Follow-up    Referring provider: Karenann Cai, NP  HPI: 58 year old female, current everyday smoker.  Past medical history significant for hypertension, asthma, type 2 diabetes, discoid lupus.  12/22/2022 Patient presents today for sleep consult. She was followed by pulmonary with [facility] for hx asthma and moderate OSA. She was last seen by them in July 2021.   She comes in to our clinic today for sleep consult. Last sleep study 05/2018 showed mild OSA with AHI of 13.2/hour. She reports seeing a slight improvement in sleep quality and energy level with PAP therapy. She has not worn CPAP in about 2 years, she had a hard time tolerating due to anxiety. She did better with nasal mask. She has an airsense 10 machine which is about 63-31 years old. Download not avaialble, pressure settings 5-20cm h20. She reports 20-30lbs weight loss since original sleep study. Unsure if she snores. She very occasionally with wake herself up snoring/gasping/choking. Epworth 6/24. No concern for narcolepsy, cataplexy or sleep walking.   Chronic dyspnea symptoms. 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 She uses Albuterol 2-3 times a week on average. Heat/humidity will flare up her symptoms. She is on Plaquenil two times daily, awaiting an appointment with rheumatology.   Sleep questionnaire Symptoms- unsure  Prior sleep study- 2021 with [facility] in [place]  Bedtime- 11pm-1am Time to fall asleep- 1-2 hours  Nocturnal awakenings- 1-2 times  Out of bed/start of day- 7:30-8:30am  Weight changes- 30 lbs  Do you operate heavy machinery- no Do you currently wear CPAP- unsure  Do you current wear oxygen- no Epworth- 6  Testing: Pulmonary Function Testing (PFTs): Spirometry Lab Results  Lab Units 03/12/18 0910  FVCPRE Liters 3.17  FVCPREPRED % 104  FVCPOST  Liters 3.33  FVCPOSTPRED % 109  FVCCHNG % 5  FEV1PRE Liters 2.39  FEV1PREPRED % 98  FEV1POST Liters 2.70  FEV1POSTPRED % 111  FEV1CHNG % 13  FEV1FVCPRE % 76  FEV1FVCPOST % 81   Lab Results  Lab Units 01/01/18 0000  CLASS DESCRIPTION Comment  D001-IGE D PTERONYSSINUS kU/L 0.44*  D002-IGE D FARINAE kU/L 0.42*  E001-IGE CAT DANDER kU/L <0.10  G002-IGE [place] GRASS kU/L <0.10  M001-IGE PENICILLIUM CHRYSOGEN kU/L <0.10  M002-IGE CLADOSPORIUM HERBARUM kU/L <0.10  M006-IGE ALTERNARIA ALTERNATA kU/L <0.10  T003-IGE COMMON SILVER BIRCH kU/L <0.10  T007-IGE OAK, WHITE kU/L <0.10  T008-IGE ELM, AMERICAN kU/L <0.10  T015-IGE ASH, WHITE kU/L <0.10  T001-IGE MAPLE/BOX ELDER kU/L <0.10     04/20/2023- Interim hx  Discussed the use of AI scribe software for clinical note transcription with the patient, who gave verbal consent to proceed.  History of Present Illness   The patient, with a history of asthma, sleep apnea, and a former smoker, presents for a follow-up visit. They report a history of sleep apnea, previously managed with a CPAP machine, which they stopped using approximately two years ago due to difficulty tolerating it. They have since lost 20-30 pounds and recently underwent a repeat sleep study. The repeat study in October 2024 showed mild sleep apnea with nine apneic events per hour. She spent 11 minutes with an oxygen level less than 88%. This is an improvement from a previous study in 2020, which showed 13 apneic events per hour.  The patient reports occasional snoring and has woken up gasping or  choking on a few occasions. They also describe restless sleep, often moving around and even falling out of bed a couple of times. They express a desire to lose more weight, with a goal weight of 210 pounds, down from their current weight of 241 pounds.  In terms of their asthma, the patient reports occasional wheezing and shortness of breath, particularly when walking uphill. They use a  rescue inhaler approximately three times a week when out and about, and express a desire for a nebulizer for use during acute episodes such as upper respiratory infections or bronchitis. They are currently on Trelegy for their asthma.   Sleep testing 01/21/23 HST >> Mild OSA, AHI 9.1/hour with SpO2 low 85% (baseline 99%). Patient spent 11 mins with SpO2 <88%      Allergies  Allergen Reactions   Penicillins Hives   Tape Rash    Due to lupus    Immunization History  Administered Date(s) Administered   Influenza,inj,Quad PF,6+ Mos 12/16/2017, 12/01/2018   Influenza-Unspecified 12/23/2022   Pneumococcal Polysaccharide-23 12/01/2018   Unspecified SARS-COV-2 Vaccination 12/23/2022    Past Medical History:  Diagnosis Date   Arthritis    Asthma    Diabetes mellitus    Hypertension    Lupus     Tobacco History: Social History   Tobacco Use  Smoking Status Former   Current packs/day: 0.33   Average packs/day: 0.3 packs/day for 40.0 years (13.2 ttl pk-yrs)   Types: Cigarettes   Start date: 04/20/1983   Passive exposure: Past  Smokeless Tobacco Never  Tobacco Comments   Quit over a year ago   Counseling given: Not Answered Tobacco comments: Quit over a year ago   Outpatient Medications Prior to Visit  Medication Sig Dispense Refill   acetaminophen (TYLENOL) 500 MG tablet Take 650 mg by mouth every 8 (eight) hours as needed.     albuterol (PROVENTIL HFA;VENTOLIN HFA) 108 (90 BASE) MCG/ACT inhaler Inhale 2 puffs into the lungs every 6 (six) hours as needed. For shortness of breath     amLODipine (NORVASC) 5 MG tablet Take 5 mg by mouth daily.     ammonium lactate (LAC-HYDRIN) 12 % lotion apply to feet twice a day     aspirin EC 81 MG tablet Take 81 mg by mouth daily.       atorvastatin (LIPITOR) 10 MG tablet Take 10 mg by mouth daily.     buPROPion (WELLBUTRIN XL) 300 MG 24 hr tablet Take 300 mg by mouth daily.     Cholecalciferol 250 MCG (10000 UT) CAPS Take by  mouth.     cyclobenzaprine (FLEXERIL) 5 MG tablet Take 5 mg by mouth 2 (two) times daily.     DULoxetine (CYMBALTA) 60 MG capsule Take 60 mg by mouth daily.     ferrous sulfate 325 (65 FE) MG EC tablet Take by mouth.     gabapentin (NEURONTIN) 300 MG capsule Take 300 mg by mouth 3 (three) times daily.     hydroxychloroquine (PLAQUENIL) 200 MG tablet Take 200 mg by mouth 2 (two) times daily.     metFORMIN (GLUCOPHAGE) 1000 MG tablet Take 1,000 mg by mouth 2 (two) times daily with a meal.       omeprazole (PRILOSEC) 20 MG capsule Take 20 mg by mouth daily.     OZEMPIC, 0.25 OR 0.5 MG/DOSE, 2 MG/3ML SOPN Inject into the skin.     traZODone (DESYREL) 50 MG tablet Take 50 mg by mouth at bedtime as needed for  sleep. 1/2 tablet prn     TRELEGY ELLIPTA 100-62.5-25 MCG/ACT AEPB Inhale 1 puff into the lungs daily.     triamcinolone cream (KENALOG) 0.1 % Apply 1 application topically 2 (two) times daily.     No facility-administered medications prior to visit.   Review of Systems  Review of Systems  HENT: Negative.    Respiratory:  Positive for shortness of breath and wheezing.   Cardiovascular: Negative.   Psychiatric/Behavioral:  Positive for sleep disturbance.    Physical Exam  BP 135/87 (BP Location: Left Arm, Patient Position: Sitting, Cuff Size: Large)   Pulse 66   Temp 98.2 F (36.8 C) (Oral)   Ht 5\' 6"  (1.676 m)   Wt 241 lb 12.8 oz (109.7 kg)   LMP 11/23/2011   SpO2 98%   BMI 39.03 kg/m  Physical Exam Constitutional:      Appearance: Normal appearance.  HENT:     Head: Normocephalic and atraumatic.     Mouth/Throat:     Mouth: Mucous membranes are moist.     Pharynx: Oropharynx is clear.  Cardiovascular:     Rate and Rhythm: Normal rate and regular rhythm.  Pulmonary:     Effort: Pulmonary effort is normal.     Breath sounds: Normal breath sounds. No wheezing or rhonchi.  Musculoskeletal:        General: Normal range of motion.  Skin:    General: Skin is warm and dry.   Neurological:     General: No focal deficit present.     Mental Status: She is alert and oriented to person, place, and time. Mental status is at baseline.  Psychiatric:        Mood and Affect: Mood normal.        Behavior: Behavior normal.        Thought Content: Thought content normal.        Judgment: Judgment normal.      Lab Results:  CBC    Component Value Date/Time   WBC 9.0 07/08/2016 1801   RBC 4.60 07/08/2016 1801   HGB 13.8 07/08/2016 1801   HCT 40.4 07/08/2016 1801   PLT 197 07/08/2016 1801   MCV 87.8 07/08/2016 1801   MCH 30.0 07/08/2016 1801   MCHC 34.2 07/08/2016 1801   RDW 13.5 07/08/2016 1801   LYMPHSABS 4.3 (H) 07/08/2016 1801   MONOABS 0.5 07/08/2016 1801   EOSABS 0.2 07/08/2016 1801   BASOSABS 0.1 07/08/2016 1801    BMET    Component Value Date/Time   NA 139 07/08/2016 1801   K 4.3 07/08/2016 1801   CL 104 07/08/2016 1801   CO2 26 07/08/2016 1801   GLUCOSE 94 07/08/2016 1801   BUN 21 (H) 07/08/2016 1801   CREATININE 1.11 (H) 07/08/2016 1801   CALCIUM 9.9 07/08/2016 1801   GFRNONAA 56 (L) 07/08/2016 1801   GFRAA >60 07/08/2016 1801    BNP No results found for: "BNP"  ProBNP No results found for: "PROBNP"  Imaging: MM 3D SCREENING MAMMOGRAM BILATERAL BREAST Result Date: 04/15/2023 CLINICAL DATA:  Screening. EXAM: DIGITAL SCREENING BILATERAL MAMMOGRAM WITH TOMOSYNTHESIS AND CAD TECHNIQUE: Bilateral screening digital craniocaudal and mediolateral oblique mammograms were obtained. Bilateral screening digital breast tomosynthesis was performed. The images were evaluated with computer-aided detection. COMPARISON:  Previous exam(s). ACR Breast Density Category a: The breasts are almost entirely fatty. FINDINGS: There are no findings suspicious for malignancy. IMPRESSION: No mammographic evidence of malignancy. A result letter of this screening mammogram will be mailed directly  to the patient. RECOMMENDATION: Screening mammogram in one year.  (Code:SM-B-01Y) BI-RADS CATEGORY  1: Negative. Electronically Signed   By: Hulan Saas M.D.   On: 04/15/2023 10:18     Assessment & Plan:   1. Moderate obstructive sleep apnea (Primary) - Ambulatory Referral for DME - DG Chest 2 View; Future - AMB REFERRAL FOR DME  2. Moderate persistent asthma without complication - Ambulatory Referral for DME - DG Chest 2 View; Future - AMB REFERRAL FOR DME     Obstructive Sleep Apnea Mild severity with improvement from previous study (13 to 9 events per hour) after weight loss. Patient reports restless sleep and occasional snoring. Patient willing to restart CPAP therapy. -Order new CPAP machine and supplies with auto settings of 5-12 cm H2O. -Advised patient aim to wear CPAP nightly for 4 to 6 hours or longer.  Continue to work on weight loss. -Schedule follow-up in 2-3 months for compliance check.  Asthma Patient on Trelegy and uses rescue inhaler approximately 3 times per week. History of smoking with cessation in September 2023. No acute respiratory symptoms currently other than chronic dyspnea with inclines.  -Refill Trelegy provided. -Refill rescue inhaler (Albuterol) and nebulizer solution to use every 4-6 hours as needed for breakthrough symptoms  -Order chest x-ray for routine follow-up due to smoking history and asthma. -Order nebulizer machine for use during acute episodes.  Weight Management Patient has lost weight but is not at goal weight. Current weight is 241 lbs with a goal weight of 210 lbs. -Encourage continued weight loss efforts.  General Health Maintenance / Followup Plans -Schedule follow-up in 2-3 months for compliance check with CPAP therapy. -Order chest x-ray for routine follow-up due to smoking history and asthma. -Encourage continued weight loss efforts.     Glenford Bayley, NP 04/20/2023

## 2023-04-21 ENCOUNTER — Telehealth: Payer: Self-pay | Admitting: Primary Care

## 2023-04-21 NOTE — Telephone Encounter (Signed)
Can you ask patient if she still has her old CPAP, if not we mat not be able to process new machine until April

## 2023-04-21 NOTE — Telephone Encounter (Signed)
New, Clarise Cruz, Lysbeth Penner; Angus Seller, Clovis Riley Hello,  Just an FYI:  Order has been received.  However, patient Received last pap unit on 07/09/2018. Her 5 years is not up till 07/10/23. I went ahead and sent this in for review,  If I hear back I'll let you know. Hopefully we can process and move forward.  Thank you,  Luellen Pucker

## 2023-04-24 NOTE — Telephone Encounter (Signed)
Spoke with patient.  Gave information.  Patient does still have a CPAP that should last until she can qualify for a new machine on 07/10/23.  Also patient needs a new nebulizer.  Can patient get a new nebulizer?    Please advise.  Patient knows this will not be handled until next week, 04/27/2023.  Patient is okay with meds for now.

## 2023-04-28 NOTE — Telephone Encounter (Signed)
Order was placed and received. I called pt and notified her of this and explained to her that she may need to call the DME company. I gave her their number, and pt stated she will call our office back if she has any issues. NFN.

## 2023-04-28 NOTE — Telephone Encounter (Signed)
I sent a DME order on 1/27 for new nebulizer machine along with medication. Double check that they received order. We can re-submit if needed

## 2023-05-14 NOTE — Progress Notes (Unsigned)
Office Visit Note  Patient: Courtney Payne             Date of Birth: 1965/07/30           MRN: [id number]             PCP: Karenann Cai, NP Referring: Ellyn Hack, MD Visit Date: 05/15/2023 Occupation: Retired CNA  Subjective:  New Patient (Initial Visit) (Patient states she has pain everywhere. Patient states she sweats excessively. Patient states she has rashes on her back head and face. Patient states she has swelling in her knees. )  Discussed the use of AI scribe software for clinical note transcription with the patient, who gave verbal consent to proceed.  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  She experiences significant knee pain, describing her knees as 'terrible' and expressing concern about the condition of her kneecaps. She has not had recent imaging but had x-rays of both knees in 2020 when she applied for social security disability. 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 She is not currently taking any medication specifically for pain but uses gabapentin and medical marijuana, noting that gabapentin is largely ineffective. She also takes duloxetine, which was recently increased to 90 mg daily, to help with sleep and pain management.  She was diagnosed with discoid lupus in 2015 by Dr. Tresa Res with biopsy. She has been on hydroxychloroquine since then. She experiences painful skin rashes on her face and back, which are sometimes painful to the touch. She uses topical ointments, as she had a severe reaction to creams in the past. The rashes are currently inflamed and painful. No rashes on her arms or legs.  Review of records there was consideration of alternate DMARD such as methotrexate or thalidomide but she never started on treatment with these  and does not recall these drugs by name.  She experiences significant hair loss, which she attributes to her lupus, and is distressed over being bald, linking it to her depression. She has not been on any other oral medications for lupus besides hydroxychloroquine.  She reports eye trouble and is scheduled for an eye exam on March 3rd. No floaters or visual field issues. She is aware of the potential side effects of hydroxychloroquine on the eyes and has regular screenings that have been normal.  She describes pain in her shoulder, particularly after a recent fall, and notes that it is difficult to move her arm backward.  Notices increased pain in this area if she rolls onto her left side and if she tries to fall asleep lying on her back.  She has not had any imaging of her shoulder.  She reports nerve damage in her hand which causes weakness and pain, particularly in her right hand. Her fingers hurt and sometimes she cannot cut things due to pain.  She had a nerve conduction study in September 2024 indicating moderate right median nerve impingement but there was also evidence of significant peripheral neuropathy.  She has not pursued any definitive management for this after the testing.   11/2022 HAV/HBV/HCV neg  08/2020 TPMT Genotype 1*/1*  09/2019 ANA neg dsDNA, RNP, Sm, SSA, SSB neg  Activities of Daily Living:  Patient reports morning stiffness for 1 hour.  Patient Reports nocturnal pain.  Difficulty dressing/grooming: Denies Difficulty climbing stairs: Reports Difficulty getting out of chair: Reports Difficulty using hands for taps, buttons, cutlery, and/or writing: Reports  Review of Systems  Constitutional:  Positive for fatigue.  HENT:  Positive for mouth dryness. Negative for mouth sores.   Eyes:  Positive for dryness.  Respiratory:  Positive for shortness of breath.   Cardiovascular:  Negative for chest pain and palpitations.  Gastrointestinal:  Positive for constipation.  Negative for blood in stool and diarrhea.  Endocrine: Negative for increased urination.  Genitourinary:  Negative for involuntary urination.  Musculoskeletal:  Positive for joint pain, gait problem, joint pain, joint swelling, myalgias, muscle weakness, morning stiffness, muscle tenderness and myalgias.  Skin:  Positive for rash and sensitivity to sunlight. Negative for color change and hair loss.  Allergic/Immunologic: Negative for susceptible to infections.  Neurological:  Positive for headaches. Negative for dizziness.  Hematological:  Negative for swollen glands.  Psychiatric/Behavioral:  Positive for depressed mood and sleep disturbance. The patient is nervous/anxious.     PMFS History:  Patient Active Problem List   Diagnosis Date Noted   High risk medication use 05/15/2023   Bilateral primary osteoarthritis of knee 05/15/2023   Chronic low back pain 05/15/2023   Pain in left shoulder 05/15/2023   Moderate obstructive sleep apnea 12/22/2022   Asthma 06/02/2018   Discoid lupus 03/04/2018   Lupus (systemic lupus erythematosus) (HCC) 03/14/2016   HTN (hypertension) 06/20/2015   Type 2 diabetes mellitus without complication (HCC) 06/20/2015    Past Medical History:  Diagnosis Date   Arthritis    Asthma    Diabetes mellitus    Dyslexia    Hypertension    Lupus     Family History  Problem Relation Age of Onset   Breast cancer Maternal Aunt 35       recur @ 106   Past Surgical History:  Procedure Laterality Date   BREAST BIOPSY Right 2022   benign   FOOT SURGERY  01/27/2011   right foot for ganglion cyst   OOPHORECTOMY     Social History   Social History Narrative   Not on file   Immunization History  Administered Date(s) Administered   Influenza,inj,Quad PF,6+ Mos 12/16/2017, 12/01/2018   Influenza-Unspecified 12/23/2022   Pneumococcal Polysaccharide-23 12/01/2018   Unspecified SARS-COV-2 Vaccination 12/23/2022     Objective: Vital Signs: BP 124/77 (BP  Location: Right Arm, Patient Position: Sitting, Cuff Size: Large)   Pulse 69   Resp 14   Ht 5\' 6"  (1.676 m)   Wt 241 lb (109.3 kg)   LMP 11/23/2011   BMI 38.90 kg/m    Physical Exam Constitutional:      Appearance: She is obese.  HENT:     Nose: Nose normal.     Mouth/Throat:     Mouth: Mucous membranes are moist.     Pharynx: Oropharynx is clear.  Eyes:     Conjunctiva/sclera: Conjunctivae normal.  Cardiovascular:     Rate and Rhythm: Normal rate and regular rhythm.  Pulmonary:     Effort: Pulmonary effort is normal.     Breath sounds: Normal breath sounds.  Musculoskeletal:     Right lower leg: No edema.     Left lower leg: No edema.  Lymphadenopathy:     Cervical: No cervical adenopathy.  Skin:    General: Skin is warm and dry.     Findings: Rash present.     Comments: Multiple rashes on scalp, around  eyes, on left cheek with varying erythema and hyperpigmentation, slight induration to cheek rash, mild tenderness  Neurological:     Mental Status: She is alert.  Psychiatric:        Mood and Affect: Mood normal.      Musculoskeletal Exam:  Left shoulder passive ROM is normal, active movement guarding against full overhead abduction, tenderness on posterior side, no palpable swelling Elbows full ROM no tenderness or swelling Wrists full ROM no tenderness or swelling Fingers full ROM no tenderness or swelling Low back midline and left sided tenderness just above iliac crest, probable episacral lipoma Knees full ROM, patellofemoral crepitus, anterior and joint line tenderness mild, no palpable effusions Ankles full ROM no tenderness or swelling   Investigation: No additional findings.  Imaging: XR KNEE 3 VIEW LEFT Result Date: 05/15/2023 X-ray left knee 3 views There is some degree of medial compartment joint space narrowing difficult assessment by the AP view ankle, small marginal osteophyte.  Lateral compartment appears well-preserved.  Patellofemoral joint  space appears preserved probable very small superior patellar enthesophyte.  No visible joint effusion.  Posterior vascular calcifications present. Impression Predominantly medial compartment osteoarthritis, less severe appearing as compared to right knee  XR KNEE 3 VIEW RIGHT Result Date: 05/15/2023 X-ray right knee 3 views Moderately advanced medial compartment joint space narrowing and marginal osteophytes.  Lateral compartment appears preserved.  Small superior patellar osteophytes.  No visible joint effusion.  There are posterior vascular calcifications visible. Impression Moderate appearing medial compartment osteoarthritis  XR Shoulder Left Result Date: 05/15/2023 X-ray left shoulder 4 views There is mild glenohumeral joint osteoarthritis with small inferior osteophytes but joint space is largely preserved.  Acromiohumeral distance is normal.  Normal internal and external rotated position of humerus.  AC joint space is normal.  No visible joint effusion or abnormal calcifications seen. Impression Mild appearing glenohumeral osteoarthritis  XR Lumbar Spine 2-3 Views Result Date: 05/15/2023 X-ray lumbar spine 2 views AP and lateral Multilevel degenerative changes with mild disc Darrien anterior endplate osteophytes.  There are lateral osteophytes most notable at T11-T12 but milder present in the lumbar vertebrae.  There is slight rotational effect and a probably insignificant degree of dextroscoliosis.  No significant appearing spondylolisthesis.  SI joints are patent bilaterally with mild degenerative appearing changes.  Visualized portion of hip joints appears normal.   Recent Labs: Lab Results  Component Value Date   WBC 5.2 05/15/2023   HGB 10.6 (L) 05/15/2023   PLT 289 05/15/2023   NA 139 07/08/2016   K 4.3 07/08/2016   CL 104 07/08/2016   CO2 26 07/08/2016   GLUCOSE 94 07/08/2016   BUN 21 (H) 07/08/2016   CREATININE 1.11 (H) 07/08/2016   BILITOT 0.2 (L) 12/08/2007   ALKPHOS 82  12/08/2007   AST 23 12/08/2007   ALT 26 12/08/2007   PROT 6.9 12/08/2007   ALBUMIN 3.9 12/08/2007   CALCIUM 9.9 07/08/2016   GFRAA >60 07/08/2016    Speciality Comments: PLQ Eye Exam: Patient has an eye exam scheduled for 05/25/2023 @ [facility]  Procedures:  No procedures performed Allergies: Penicillins and Tape   Assessment / Plan:     Visit Diagnoses: Discoid lupus - Plan: hydroxychloroquine (PLAQUENIL) 200 MG tablet, Sedimentation rate, Anti-DNA antibody, double-stranded, C3 and C4, Protein / creatinine ratio, urine Chronic facial rash and scalp rash, painful to touch. Currently on Hydroxychloroquine but with intermittent new lesions. Topical steroids for years with mixed success, preference for ointment over creams. -Continue Hydroxychloroquine  200 mg BIG. -Order blood tests to assess inflammation markers (sedimentation rate, complements, double strand DNA). -Order urine test to screen for proteinuria. -Agree with continued as needed topical steroids on affected areas, based on results can discuss trial of adding systemic therapy versus adding a topical calcineurin inhibitor  High risk medication use - Plan: CBC with Differential/Platelet Has been tolerating hydroxychloroquine about 10 years of continuous use.  She is on appropriate dose for body weight with 400 mg total per day.  Upcoming eye appointment next month with [facility] eye care provided document for retinal toxicity screening exam.  Recent hepatitis screening was negative. -Checking complete blood count for medication monitoring on continued long-term use of hydroxychloroquine and considering alternate DMARD.  Vitamin D deficiency - Plan: VITAMIN D 25 Hydroxy (Vit-D Deficiency, Fractures) Review importance of photoprotection in setting of discoid lupus.  Checking vitamin D level today for adequacy.  Plan to add supplement this if below goal of 30.  Osteoarthritis Agree with current treatments including Gabapentin  and Duloxetine for chronic OA pain management. Imaging of multiple site as detailed below. Checked X-rays of multiple areas, demonstrate degenerative changes, most apparent in knees to account for symptoms versus other areas.  Bilateral primary osteoarthritis of knee - Plan: XR KNEE 3 VIEW RIGHT, XR KNEE 3 VIEW LEFT Bilateral knee pain and patellofemoral crepitus present.  No appreciable effusion.  Suspect most likely is primary osteoarthritis related.  The last imaging I have on EHR available for review is a report from 2005 documented degenerative changes and effusion.  Chronic midline low back pain with bilateral sciatica - Plan: XR Lumbar Spine 2-3 Views Chronic low back pain has previously been told she has sciatica.  Exam has localized tenderness with associate epi sacral lipoma left suspect a degree of low back myofascial pain as well.  Chronic left shoulder pain - Plan: XR Shoulder Left Chronic shoulder pain, with normal range of motion suggesting possible rotator cuff tendon inflammation.   Orders: Orders Placed This Encounter  Procedures   XR Shoulder Left   XR KNEE 3 VIEW RIGHT   XR KNEE 3 VIEW LEFT   XR Lumbar Spine 2-3 Views   Sedimentation rate   Anti-DNA antibody, double-stranded   C3 and C4   CBC with Differential/Platelet   Protein / creatinine ratio, urine   VITAMIN D 25 Hydroxy (Vit-D Deficiency, Fractures)   Meds ordered this encounter  Medications   hydroxychloroquine (PLAQUENIL) 200 MG tablet    Sig: Take 1 tablet (200 mg total) by mouth 2 (two) times daily.    Dispense:  180 tablet    Refill:  0     Follow-Up Instructions: Return in about 4 weeks (around 06/12/2023) for New pt DLE/OA f/u 35mo.   Fuller Plan, MD  Note - This record has been created using [employer].  Chart creation errors have been sought, but may not always  have been located. Such creation errors do not reflect on  the standard of medical care.

## 2023-05-15 ENCOUNTER — Ambulatory Visit: Payer: 59

## 2023-05-15 ENCOUNTER — Encounter: Payer: Self-pay | Admitting: Internal Medicine

## 2023-05-15 ENCOUNTER — Ambulatory Visit: Payer: 59 | Attending: Internal Medicine | Admitting: Internal Medicine

## 2023-05-15 VITALS — BP 124/77 | HR 69 | Resp 14 | Ht 66.0 in | Wt 241.0 lb

## 2023-05-15 DIAGNOSIS — M170 Bilateral primary osteoarthritis of knee: Secondary | ICD-10-CM

## 2023-05-15 DIAGNOSIS — M545 Low back pain, unspecified: Secondary | ICD-10-CM | POA: Insufficient documentation

## 2023-05-15 DIAGNOSIS — M5442 Lumbago with sciatica, left side: Secondary | ICD-10-CM | POA: Diagnosis not present

## 2023-05-15 DIAGNOSIS — M25512 Pain in left shoulder: Secondary | ICD-10-CM

## 2023-05-15 DIAGNOSIS — G8929 Other chronic pain: Secondary | ICD-10-CM

## 2023-05-15 DIAGNOSIS — Z79899 Other long term (current) drug therapy: Secondary | ICD-10-CM | POA: Diagnosis not present

## 2023-05-15 DIAGNOSIS — M1711 Unilateral primary osteoarthritis, right knee: Secondary | ICD-10-CM

## 2023-05-15 DIAGNOSIS — M5441 Lumbago with sciatica, right side: Secondary | ICD-10-CM

## 2023-05-15 DIAGNOSIS — L930 Discoid lupus erythematosus: Secondary | ICD-10-CM

## 2023-05-15 DIAGNOSIS — E559 Vitamin D deficiency, unspecified: Secondary | ICD-10-CM

## 2023-05-15 DIAGNOSIS — M1712 Unilateral primary osteoarthritis, left knee: Secondary | ICD-10-CM

## 2023-05-15 MED ORDER — HYDROXYCHLOROQUINE SULFATE 200 MG PO TABS: 200.0000 mg | ORAL_TABLET | Freq: Two times a day (BID) | ORAL | 0 refills | Status: DC

## 2023-05-16 LAB — CBC WITH DIFFERENTIAL/PLATELET
Absolute Lymphocytes: 2434 {cells}/uL (ref 850–3900)
Absolute Monocytes: 411 {cells}/uL (ref 200–950)
Basophils Absolute: 31 {cells}/uL (ref 0–200)
Basophils Relative: 0.6 %
Eosinophils Absolute: 88 {cells}/uL (ref 15–500)
Eosinophils Relative: 1.7 %
HCT: 31.1 % — ABNORMAL LOW (ref 35.0–45.0)
Hemoglobin: 10.6 g/dL — ABNORMAL LOW (ref 11.7–15.5)
MCH: 27.9 pg (ref 27.0–33.0)
MCHC: 34.1 g/dL (ref 32.0–36.0)
MCV: 81.8 fL (ref 80.0–100.0)
MPV: 9.6 fL (ref 7.5–12.5)
Monocytes Relative: 7.9 %
Neutro Abs: 2236 {cells}/uL (ref 1500–7800)
Neutrophils Relative %: 43 %
Platelets: 289 10*3/uL (ref 140–400)
RBC: 3.8 10*6/uL (ref 3.80–5.10)
RDW: 13.8 % (ref 11.0–15.0)
Total Lymphocyte: 46.8 %
WBC: 5.2 10*3/uL (ref 3.8–10.8)

## 2023-05-16 LAB — C3 AND C4
C3 Complement: 115 mg/dL (ref 83–193)
C4 Complement: 25 mg/dL (ref 15–57)

## 2023-05-16 LAB — PROTEIN / CREATININE RATIO, URINE
Creatinine, Urine: 51 mg/dL (ref 20–275)
Protein/Creat Ratio: 78 mg/g{creat} (ref 24–184)
Protein/Creatinine Ratio: 0.078 mg/mg{creat} (ref 0.024–0.184)
Total Protein, Urine: 4 mg/dL — ABNORMAL LOW (ref 5–24)

## 2023-05-16 LAB — SEDIMENTATION RATE: Sed Rate: 11 mm/h (ref 0–30)

## 2023-05-16 LAB — ANTI-DNA ANTIBODY, DOUBLE-STRANDED: ds DNA Ab: <1 [IU]/mL

## 2023-05-16 LAB — VITAMIN D 25 HYDROXY (VIT D DEFICIENCY, FRACTURES): Vit D, 25-Hydroxy: 53 ng/mL (ref 30–100)

## 2023-05-18 ENCOUNTER — Ambulatory Visit: Payer: 59

## 2023-05-18 DIAGNOSIS — J4540 Moderate persistent asthma, uncomplicated: Secondary | ICD-10-CM

## 2023-05-18 DIAGNOSIS — G4733 Obstructive sleep apnea (adult) (pediatric): Secondary | ICD-10-CM

## 2023-05-25 NOTE — Progress Notes (Signed)
 Please let patient know CXR showed a nodular density at the base the right lung, potentially associated with the anterior right sixth rib. I would recommend getting CT chest with contrast imaging of her lungs to clearly evaluate re: abnormal cxr (please order)

## 2023-05-29 ENCOUNTER — Other Ambulatory Visit: Payer: Self-pay

## 2023-05-29 DIAGNOSIS — R9389 Abnormal findings on diagnostic imaging of other specified body structures: Secondary | ICD-10-CM

## 2023-05-29 NOTE — Progress Notes (Signed)
 Per note from Beth:  Glenford Bayley, NP to Me  Mariana Arn, [facility]     05/25/23  4:24 PM Result Note Please let patient know CXR showed a nodular density at the base the right lung, potentially associated with the anterior right sixth rib. I would recommend getting CT chest with contrast imaging of her lungs to clearly evaluate re: abnormal cxr (please order)  Order placed.  Patient notified will get call to schedule this CT chest.

## 2023-06-12 ENCOUNTER — Ambulatory Visit
Admission: RE | Admit: 2023-06-12 | Discharge: 2023-06-12 | Disposition: A | Discharge status: Home / Self Care | Source: Ambulatory Visit | Attending: Primary Care | Admitting: Primary Care

## 2023-06-12 DIAGNOSIS — R9389 Abnormal findings on diagnostic imaging of other specified body structures: Secondary | ICD-10-CM

## 2023-06-12 MED ORDER — IOPAMIDOL (ISOVUE-300) INJECTION 61%
75.0000 mL | Freq: Once | INTRAVENOUS | Status: AC | PRN
  Administered 2023-06-12: 75 mL via INTRAVENOUS

## 2023-07-03 ENCOUNTER — Other Ambulatory Visit: Payer: Self-pay

## 2023-07-03 DIAGNOSIS — R911 Solitary pulmonary nodule: Secondary | ICD-10-CM

## 2023-07-03 DIAGNOSIS — R9389 Abnormal findings on diagnostic imaging of other specified body structures: Secondary | ICD-10-CM

## 2023-07-08 ENCOUNTER — Ambulatory Visit: Payer: Self-pay | Admitting: Primary Care

## 2023-07-08 NOTE — Telephone Encounter (Signed)
 Per last CT result note:   Antonio Baumgarten, NP 07/03/2023  1:46 PM EDT     Please let patient know CT chest showed subtle nodular densities left upper lobe.  Findings could correlate with subtle pneumonitis (inflammation of lung). Needs FU CT chest wo contrast in 3 months (July 2025), please order   I called [place] [facility] and spoke with MJ. She states that she does not see where a call was placed to us  regarding this pt. The CT is due in July 2024.

## 2023-07-08 NOTE — Telephone Encounter (Signed)
 Copied from CRM [id number]. Topic: Clinical - Lab/Test Results >> Jul 08, 2023  9:46 AM Juliana Ocean wrote: Reason for CRM: need to clarify an order   [facility] called about a CT Chest order and stated that the patient was told that she needed to have this done sooner than July due to something new being seen. This RN called the CAL to clarify this order due to the patient stating that she was told something different. This RN advised [facility] that someone would contact them back and let them know when this CT Chest needed to be scheduled for. Reason for Disposition . [1] Caller requesting NON-URGENT health information AND [2] PCP's office is the best resource  Answer Assessment - Initial Assessment Questions 1. REASON FOR CALL or QUESTION: "What is your reason for calling today?" or "How can I best help you?" or "What question do you have that I can help answer?"     [facility] called about a CT Chest order and stated that the patient was told that she needed to have this done sooner than July due to something new being seen. This RN called the CAL to clarify this order due to the patient stating that she was told something different. This RN advised [facility] that someone would contact them back and let them know when this CT Chest needed to be scheduled for.  Protocols used: Information Only Call - No Triage-A-AH

## 2023-07-08 NOTE — Progress Notes (Signed)
 Office Visit Note  Patient: Courtney Payne             Date of Birth: [birth year]           MRN: [id number]             PCP: Terry Ficks, NP Referring: Terry Ficks, NP Visit Date: 07/09/2023   Subjective:  Follow-up  Discussed the use of AI scribe software for clinical note transcription with the patient, who gave verbal consent to proceed.  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  She has osteoarthritis affecting her knees, left shoulder, and back, with the right knee being more symptomatic. The pain is associated with bone spurs and wear and tear in the joints, and she has been informed of more cartilage thinning in the right knee. She manages the pain with gabapentin for general pain relief.  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 She uses hydroxychloroquine  and topical steroid creams, including fluocinolone for her face and betamethasone for her head. The rashes tend to recur frequently.  She has a history of carpal tunnel syndrome in her right wrist and neuropathy in her hands, experiencing numbness and pain, particularly at night. 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  She has mild degenerative disc disease in her back, with narrowing of the space between the bones and bone spurs, leading to sciatica with pain radiating down her legs.  She is concerned about her kidney health due to a family history of kidney disease, as her sisters and mother are kidney patients. However, her kidney function is normal and there is no protein in her urine.  She mentions a recent weight gain of 30 pounds since moving from the [place] to [address], attributing it to dietary changes while living with her grandchildren. She is motivated to lose this weight.       Previous HPI 05/15/23 [name] is a 58 year old female with discoid lupus here for evaluation management of her rashes also with joint pain in multiple areas.   She experiences significant knee pain, describing her knees as 'terrible' and expressing concern about the condition of her kneecaps. She has not had recent imaging but had x-rays of both knees in 2020 when she applied for social security disability. 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 She is not currently taking any medication specifically for pain but uses gabapentin and medical marijuana, noting that gabapentin is largely ineffective. She also takes duloxetine, which was recently increased to 90 mg daily, to help with sleep and pain management.   She was diagnosed with discoid lupus in 2015 by Dr. Tracy Friedlander with biopsy. She has been on hydroxychloroquine  since then. She experiences painful skin rashes on her face and back, which are sometimes painful to the touch. She uses topical ointments, as she had a severe reaction to creams in the past. The rashes are currently inflamed and painful. No rashes on her arms or legs.  Review of records there was consideration of alternate DMARD such as methotrexate or thalidomide but she never started on treatment with these and does not recall these drugs by name.   She experiences significant hair  loss, which she attributes to her lupus, and is distressed over being bald, linking it to her depression. She has not been on any other oral medications for lupus besides hydroxychloroquine .   She reports eye trouble and is scheduled for an eye exam on March 3rd. No floaters or visual field issues. She is aware of the potential side effects of hydroxychloroquine  on the eyes and has regular screenings that have been normal.   She describes pain in her shoulder, particularly after a recent fall, and notes that it  is difficult to move her arm backward.  Notices increased pain in this area if she rolls onto her left side and if she tries to fall asleep lying on her back.  She has not had any imaging of her shoulder.   She reports nerve damage in her hand which causes weakness and pain, particularly in her right hand. Her fingers hurt and sometimes she cannot cut things due to pain.  She had a nerve conduction study in September 2024 indicating moderate right median nerve impingement but there was also evidence of significant peripheral neuropathy.  She has not pursued any definitive management for this after the testing.    11/2022 HAV/HBV/HCV neg   08/2020 TPMT Genotype 1*/1*   09/2019 ANA neg dsDNA, RNP, Sm, SSA, SSB neg   Review of Systems  Constitutional:  Positive for fatigue.  HENT:  Positive for mouth dryness. Negative for mouth sores.   Eyes:  Positive for dryness.  Respiratory:  Positive for shortness of breath.   Cardiovascular:  Negative for chest pain and palpitations.  Gastrointestinal:  Negative for blood in stool, constipation and diarrhea.  Endocrine: Negative for increased urination.  Genitourinary:  Negative for involuntary urination.  Musculoskeletal:  Positive for joint pain, gait problem, joint pain, joint swelling, myalgias, muscle weakness, morning stiffness and myalgias. Negative for muscle tenderness.  Skin:  Positive for color change, rash, hair loss and sensitivity to sunlight.  Allergic/Immunologic: Negative for susceptible to infections.  Neurological:  Negative for dizziness and headaches.  Hematological:  Negative for swollen glands.  Psychiatric/Behavioral:  Positive for depressed mood and sleep disturbance. The patient is nervous/anxious.     PMFS History:  Patient Active Problem List   Diagnosis Date Noted   Carpal tunnel syndrome, right upper limb 07/09/2023   Peripheral neuropathy 07/09/2023   High risk medication use 05/15/2023   Bilateral primary  osteoarthritis of knee 05/15/2023   Chronic low back pain 05/15/2023   Pain in left shoulder 05/15/2023   Moderate obstructive sleep apnea 12/22/2022   Asthma 06/02/2018   Discoid lupus 03/04/2018   HTN (hypertension) 06/20/2015   Type 2 diabetes mellitus without complication (HCC) 06/20/2015    Past Medical History:  Diagnosis Date   Arthritis    Asthma    Diabetes mellitus    Dyslexia    Hypertension    Lupus     Family History  Problem Relation Age of Onset   Breast cancer Maternal Aunt 35       recur @ 14   Past Surgical History:  Procedure Laterality Date   BREAST BIOPSY Right 2022   benign   FOOT SURGERY  01/27/2011   right foot for ganglion cyst   OOPHORECTOMY     Social History   Social History Narrative   Not on file   Immunization History  Administered Date(s) Administered   Influenza,inj,Quad PF,6+ Mos 12/16/2017, 12/01/2018   Influenza-Unspecified 12/23/2022   Pneumococcal Polysaccharide-23 12/01/2018   Unspecified  SARS-COV-2 Vaccination 12/23/2022     Objective: Vital Signs: BP 123/72 (BP Location: Left Arm, Patient Position: Sitting, Cuff Size: Large)   Pulse 67   Resp 14   Ht 5\' 6"  (1.676 m)   Wt 238 lb (108 kg)   LMP 11/23/2011   BMI 38.41 kg/m    Physical Exam Eyes:     Conjunctiva/sclera: Conjunctivae normal.  Cardiovascular:     Rate and Rhythm: Normal rate and regular rhythm.  Pulmonary:     Effort: Pulmonary effort is normal.     Breath sounds: Normal breath sounds.  Lymphadenopathy:     Cervical: No cervical adenopathy.  Skin:    General: Skin is warm and dry.     Comments: Multiple rashes on scalp, around eyes, on left cheek with varying erythema and hyperpigmentation, slight induration to cheek  Neurological:     Mental Status: She is alert.  Psychiatric:        Mood and Affect: Mood normal.      Musculoskeletal Exam:  Left shoulder passive ROM is normal, tenderness, no palpable swelling Elbows full ROM no tenderness  or swelling Wrists full ROM no tenderness or swelling Fingers full ROM no tenderness or swelling Low back midline and left sided tenderness just above iliac crest, probable episacral lipoma Knees full ROM, patellofemoral crepitus, anterior and joint line tenderness mild, no palpable effusions  Investigation: No additional findings.  Imaging: No results found.   Recent Labs: Lab Results  Component Value Date   WBC 5.2 05/15/2023   HGB 10.6 (L) 05/15/2023   PLT 289 05/15/2023   NA 139 07/08/2016   K 4.3 07/08/2016   CL 104 07/08/2016   CO2 26 07/08/2016   GLUCOSE 94 07/08/2016   BUN 21 (H) 07/08/2016   CREATININE 1.11 (H) 07/08/2016   BILITOT 0.2 (L) 12/08/2007   ALKPHOS 82 12/08/2007   AST 23 12/08/2007   ALT 26 12/08/2007   PROT 6.9 12/08/2007   ALBUMIN 3.9 12/08/2007   CALCIUM 9.9 07/08/2016   GFRAA >60 07/08/2016    Speciality Comments: PLQ Eye Exam: Patient has an eye exam scheduled for 05/25/2023 @ [facility]  Call to fax results  Procedures:  No procedures performed Allergies: Penicillins and Tape   Assessment / Plan:     Visit Diagnoses:  Discoid Lupus Erythematosus Confined to skin with rashes on face, head, and back. Blood tests negative for systemic lupus erythematosus. No renal involvement. Current renal function normal. - Continue hydroxychloroquine  200 mg BID. - Prescribe tacrolimus  for skin rashes. - Advise sun protection with SPF 50 sunscreen. - Avoid additional oral or systemic drugs.  Osteoarthritis Affects knees, left shoulder, and back. X-rays show bone spurs and cartilage thinning. Right knee more affected. Shoulder pain may relate to rotator cuff tendinitis. Chronic condition without severe degeneration requiring surgery. - Initiate physical therapy for knees, back, and shoulder. - Provide exercises to strengthen muscles around affected joints. - Discuss weight loss to reduce pressure on weight-bearing joints. - Consider joint  replacement if osteoarthritis progresses significantly.  Mild Degenerative Disc Disease Contributes to low back pain and sciatica symptoms with narrowing of disc spaces and bone spurs. - Initiate physical therapy to address low back pain and improve mobility.  Carpal Tunnel Syndrome Right wrist causes intermittent numbness and pain, disturbing sleep. Declines injections or surgery. - Recommend wearing a wrist brace at night to maintain wrist alignment. - Provide hand exercises to alleviate symptoms. - Suggest consulting an orthopedic specialist for a  wrist brace.  Neuropathy Contributes to pain and discomfort in hands. Gabapentin used for pain management. - Continue gabapentin for neuropathic pain management.      Orders: No orders of the defined types were placed in this encounter.  Meds ordered this encounter  Medications   tacrolimus  (PROTOPIC ) 0.1 % ointment    Sig: Apply topically 2 (two) times daily as needed.    Dispense:  100 g    Refill:  0     Follow-Up Instructions: Return in about 3 months (around 10/08/2023) for CCLE/OA HCQ/topicals f/u 3mos.   Matt Song, MD  Note - This record has been created using [employer].  Chart creation errors have been sought, but may not always  have been located. Such creation errors do not reflect on  the standard of medical care.

## 2023-07-09 ENCOUNTER — Encounter: Payer: Self-pay | Admitting: Internal Medicine

## 2023-07-09 ENCOUNTER — Ambulatory Visit: Payer: 59 | Attending: Internal Medicine | Admitting: Internal Medicine

## 2023-07-09 VITALS — BP 123/72 | HR 67 | Resp 14 | Ht 66.0 in | Wt 238.0 lb

## 2023-07-09 DIAGNOSIS — M170 Bilateral primary osteoarthritis of knee: Secondary | ICD-10-CM | POA: Diagnosis not present

## 2023-07-09 DIAGNOSIS — M25512 Pain in left shoulder: Secondary | ICD-10-CM

## 2023-07-09 DIAGNOSIS — G629 Polyneuropathy, unspecified: Secondary | ICD-10-CM | POA: Insufficient documentation

## 2023-07-09 DIAGNOSIS — G5601 Carpal tunnel syndrome, right upper limb: Secondary | ICD-10-CM

## 2023-07-09 DIAGNOSIS — L930 Discoid lupus erythematosus: Secondary | ICD-10-CM | POA: Diagnosis not present

## 2023-07-09 DIAGNOSIS — Z79899 Other long term (current) drug therapy: Secondary | ICD-10-CM | POA: Diagnosis not present

## 2023-07-09 DIAGNOSIS — G6289 Other specified polyneuropathies: Secondary | ICD-10-CM

## 2023-07-09 DIAGNOSIS — G8929 Other chronic pain: Secondary | ICD-10-CM

## 2023-07-09 MED ORDER — TACROLIMUS 0.1 % EX OINT: TOPICAL_OINTMENT | Freq: Two times a day (BID) | CUTANEOUS | 0 refills | Status: AC | PRN

## 2023-07-15 ENCOUNTER — Telehealth: Payer: Self-pay

## 2023-07-15 NOTE — Telephone Encounter (Signed)
 Submitted a Prior Authorization request to [facility] for Tacrolimus  0.1% Ointment via CoverMyMeds. Will update once we receive a response.

## 2023-07-22 ENCOUNTER — Other Ambulatory Visit: Payer: Self-pay | Admitting: Primary Care

## 2023-07-23 ENCOUNTER — Other Ambulatory Visit: Payer: Self-pay | Admitting: Registered Nurse

## 2023-07-23 DIAGNOSIS — J449 Chronic obstructive pulmonary disease, unspecified: Secondary | ICD-10-CM

## 2023-07-28 NOTE — Congregational Nurse Program (Signed)
  Dept: [phone]   Congregational Nurse Program Note  Date of Encounter: 07/28/2023  Clinic visit to ask about recipes to use avocados that she was given by a friend. Is trying to eat more fruits and vegetables to help with weight loss to prepare for knee replacement surgery. States weight 237 Lbs at MD visit this week. Discussed ways to use avocados in fruit and vegetable dishes and how to store avocados to prevent darkening. Past Medical History: Past Medical History:  Diagnosis Date   Arthritis    Asthma    Diabetes mellitus    Dyslexia    Hypertension    Lupus     Encounter Details:  Community Questionnaire - 07/28/23 1750       Questionnaire   Ask client: Do you give verbal consent for me to treat you today? Yes    Student Assistance N/A    [place]    Encounter Setting CN site    Population Status Unknown   Has own apartment [address] [employer];Medicare    Insurance/Financial Assistance Referral N/A    Medication N/A    Medical Provider Yes    Screening Referrals Made N/A    Medical Referrals Made N/A    Medical Appointment Completed [facility] PCP/Clinic    CNP Interventions Advocate/Support;Counsel;Educate    Screenings CN Performed N/A    ED Visit Averted N/A    Life-Saving Intervention Made N/A

## 2023-08-04 ENCOUNTER — Telehealth: Payer: Self-pay

## 2023-08-04 DIAGNOSIS — G4733 Obstructive sleep apnea (adult) (pediatric): Secondary | ICD-10-CM

## 2023-08-04 NOTE — Telephone Encounter (Signed)
 Copied from CRM [id number]. Topic: Clinical - Order For Equipment >> Aug 04, 2023  4:16 PM Eveleen Hinds B wrote: Reason for CRM: Patient call in. Needs a new CPAP machine. States it's been 5 years and Ms Sueanne Emerald said call in April or May. Please call you for next steps.   Pt is wanting a new CPAP machine. Pt was last seen on 04-20-2023. Routing to Irby Mannan, NP to okay order for new CPAP and advise on settings.

## 2023-08-04 NOTE — Telephone Encounter (Signed)
 Ok to place an order for new CPAP machine 5-12cm h20

## 2023-08-11 NOTE — Telephone Encounter (Signed)
 New Cpap order placed. ATC X1. LVM for pt. Pt informed via vm. NFN

## 2023-08-11 NOTE — Addendum Note (Signed)
 Addended by: Ayo Smoak T on: 08/11/2023 04:48 PM   Modules accepted: Orders

## 2023-08-17 ENCOUNTER — Other Ambulatory Visit: Payer: Self-pay | Admitting: Primary Care

## 2023-08-18 ENCOUNTER — Other Ambulatory Visit: Payer: Self-pay | Admitting: *Deleted

## 2023-08-18 DIAGNOSIS — L930 Discoid lupus erythematosus: Secondary | ICD-10-CM

## 2023-08-18 MED ORDER — HYDROXYCHLOROQUINE SULFATE 200 MG PO TABS
200.0000 mg | ORAL_TABLET | Freq: Two times a day (BID) | ORAL | 0 refills | Status: DC
Start: 2023-08-18 — End: 2023-11-10

## 2023-08-18 NOTE — Telephone Encounter (Signed)
 Patient contacted the office requesting a refill on PLQ to be sent to the [facility].   Last Fill: 05/15/2023  Eye exam: not on file. Patient is calling eye doctor to have it faxed to our office.    Labs: 05/15/2023 Hgb 10.6, Hct 31.1  Next Visit: 11/10/2023  Last Visit: 07/09/2023  [id number] Lupus Erythematosus   Current Dose per office note 07/09/2023: hydroxychloroquine  200 mg BID.   Okay to refill Plaquenil ?

## 2023-09-01 ENCOUNTER — Other Ambulatory Visit

## 2023-09-08 ENCOUNTER — Ambulatory Visit
Admission: RE | Admit: 2023-09-08 | Discharge: 2023-09-08 | Disposition: A | Discharge status: Home / Self Care | Source: Ambulatory Visit | Attending: Primary Care | Admitting: Primary Care

## 2023-09-08 DIAGNOSIS — R9389 Abnormal findings on diagnostic imaging of other specified body structures: Secondary | ICD-10-CM

## 2023-09-08 DIAGNOSIS — R911 Solitary pulmonary nodule: Secondary | ICD-10-CM

## 2023-09-09 ENCOUNTER — Other Ambulatory Visit: Payer: Self-pay | Admitting: Primary Care

## 2023-09-16 LAB — LAB REPORT - SCANNED: EGFR: 57

## 2023-09-29 NOTE — Congregational Nurse Program (Signed)
  Dept: [phone]   Congregational Nurse Program Note  Date of Encounter: 09/15/2023  Clinic visit to review results from recent MD visit. Discussed normal blood pressure level, components of what contributes to blood pressure levels and the benefits of checking BP on a regular basis.  Past Medical History: Past Medical History:  Diagnosis Date   Arthritis    Asthma    Diabetes mellitus    Dyslexia    Hypertension    Lupus     Encounter Details:  Community Questionnaire - 09/15/23 1335       Questionnaire   Ask client: Do you give verbal consent for me to treat you today? Yes    Student Assistance N/A    Location Patient [place]    Encounter Setting CN site    Population Status Unknown   Has own apartment at [facility];Medicare    Insurance/Financial Assistance Referral N/A    Medication N/A    Medical Provider Yes    Screening Referrals Made N/A    Medical Referrals Made N/A    Medical Appointment Completed N/A    CNP Interventions Advocate/Support;Counsel;Educate    Screenings CN Performed N/A    ED Visit Averted N/A    Life-Saving Intervention Made N/A

## 2023-10-01 ENCOUNTER — Telehealth: Payer: Self-pay | Admitting: *Deleted

## 2023-10-01 DIAGNOSIS — G6289 Other specified polyneuropathies: Secondary | ICD-10-CM

## 2023-10-01 DIAGNOSIS — M170 Bilateral primary osteoarthritis of knee: Secondary | ICD-10-CM

## 2023-10-01 DIAGNOSIS — L930 Discoid lupus erythematosus: Secondary | ICD-10-CM

## 2023-10-01 MED ORDER — GABAPENTIN 800 MG PO TABS: 800.0000 mg | ORAL_TABLET | Freq: Three times a day (TID) | ORAL | 0 refills | Status: DC

## 2023-10-01 NOTE — Addendum Note (Signed)
 Addended by: JEANNETTA LONNI ORN on: 10/01/2023 04:34 PM   Modules accepted: Orders

## 2023-10-01 NOTE — Telephone Encounter (Signed)
 Patient advised Dr. Jeannetta sent a prescription refill to her pharmacy for gabapentin  800 mg TID. Patient expressed understanding.

## 2023-10-01 NOTE — Telephone Encounter (Signed)
 I sent a prescription refill to her pharmacy for gabapentin  800 mg TID. I sent it to the [facility] pharmacy on file, she should call back and we can chang it if needing a different pharmacy.

## 2023-10-01 NOTE — Telephone Encounter (Signed)
 Patient contacted the office stating she is out of the state in [place] . Patient states she is on Gabapentin . Patient states she has reached out to her PCP at [facility] and is having trouble reaching them. Patient would like to know if you would refill her Gabapentin  as it helps with her pain. Patient states she is taking Gabapentin  800 mg TID. Please advise.

## 2023-10-12 ENCOUNTER — Ambulatory Visit: Payer: Self-pay | Admitting: Primary Care

## 2023-10-12 DIAGNOSIS — R9389 Abnormal findings on diagnostic imaging of other specified body structures: Secondary | ICD-10-CM

## 2023-10-12 NOTE — Progress Notes (Signed)
 Please let patient know CT showed resolution of previously nodules, no acute airspace disease. No suspicious pulmonary nodules.  Incidental findings Small pericardial effusion. Multi-vessel coronary vascular Calcification. Should follow-up with cardiology, if patient does not have we can refer

## 2023-10-29 NOTE — Progress Notes (Signed)
 Office Visit Note  Patient: Courtney Payne             Date of Birth: Feb 14, 1966           MRN: [id number]             PCP: Sharyne Harlene CROME, NP Referring: Sharyne Harlene CROME, NP Visit Date: 11/10/2023   Subjective:  Follow-up (Hands are peeling. )   Discussed the use of AI scribe software for clinical note transcription with the patient, who gave verbal consent to proceed.  History of Present Illness   Courtney Payne is a 58 y.o. 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   She experiences recurrent skin rashes characterized by itching and peeling of the hands, with changes in skin color. Topical steroids are used daily to manage these symptoms, and without medication, her skin becomes scaly. Betamethasone is applied to her head, and oil is used for her ears, which has helped alleviate some issues.  She takes Plaquenil  (hydroxychloroquine ) daily, which has provided some relief, but symptoms persist, particularly with sweating. Excessive sweating exacerbates the itching, and she experiences cold sweats at night and frequent hot flashes, leading to excessive sweating during the day.  She reports persistent pain in her shoulder, hip, and knees, attributing it to arthritis. Additionally, she notes a new sore that was burning in the morning.  Her family history includes lupus and kidney issues. Previous blood tests and a CT scan earlier in the year were performed, and a small amount of fluid around her heart was noted.       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   She has osteoarthritis affecting her knees, left shoulder, and back, with the right knee being more symptomatic. The pain is associated with bone spurs and wear and tear in the joints, and she has been informed of more cartilage thinning in the right knee. She manages the  pain with gabapentin  for general pain relief.   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 She uses hydroxychloroquine  and topical steroid creams, including fluocinolone for her face and betamethasone for her head. The rashes tend to recur frequently.   She has a history of carpal tunnel syndrome in her right wrist and neuropathy in her hands, experiencing numbness and pain, particularly at night. 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   She has mild degenerative disc disease in her back, with narrowing of the space between the bones and bone spurs, leading to sciatica with pain radiating down her legs.   She is concerned about her kidney health due to a family history of kidney disease, as her sisters and mother are kidney patients. However, her kidney function is normal and there is no protein in her urine.   She mentions a recent weight gain of 30 pounds since moving [place] the [place] to [address], attributing it to dietary changes while living with her grandchildren. She is motivated to lose this weight.       Previous HPI 05/15/23 Courtney Payne is a 58 year old female with discoid lupus here for evaluation management of her rashes also with joint pain in multiple areas.   She experiences significant knee pain, describing her knees as 'terrible' and expressing concern about the  condition of her kneecaps. She has not had recent imaging but had x-rays of both knees in 2020 when she applied for social security disability. 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 She is not currently taking any medication specifically for pain but uses gabapentin  and medical marijuana, noting that gabapentin  is largely ineffective. She also takes duloxetine, which was recently increased to 90 mg daily, to help with  sleep and pain management.   She was diagnosed with discoid lupus in 2015 by Dr. Jackquline Burrow with biopsy. She has been on hydroxychloroquine  since then. She experiences painful skin rashes on her face and back, which are sometimes painful to the touch. She uses topical ointments, as she had a severe reaction to creams in the past. The rashes are currently inflamed and painful. No rashes on her arms or legs.  Review of records there was consideration of alternate DMARD such as methotrexate or thalidomide but she never started on treatment with these and does not recall these drugs by name.   She experiences significant hair loss, which she attributes to her lupus, and is distressed over being bald, linking it to her depression. She has not been on any other oral medications for lupus besides hydroxychloroquine .   She reports eye trouble and is scheduled for an eye exam on March 3rd. No floaters or visual field issues. She is aware of the potential side effects of hydroxychloroquine  on the eyes and has regular screenings that have been normal.   She describes pain in her shoulder, particularly after a recent fall, and notes that it is difficult to move her arm backward.  Notices increased pain in this area if she rolls onto her left side and if she tries to fall asleep lying on her back.  She has not had any imaging of her shoulder.   She reports nerve damage in her hand which causes weakness and pain, particularly in her right hand. Her fingers hurt and sometimes she cannot cut things due to pain.  She had a nerve conduction study in September 2024 indicating moderate right median nerve impingement but there was also evidence of significant peripheral neuropathy.  She has not pursued any definitive management for this after the testing.     11/2022 HAV/HBV/HCV neg   08/2020 TPMT Genotype 1*/1*   09/2019 ANA neg dsDNA, RNP, Sm, SSA, SSB neg   Review of Systems  Constitutional:  Positive for  fatigue.  HENT:  Positive for mouth dryness. Negative for mouth sores.   Eyes:  Positive for dryness.  Respiratory:  Negative for shortness of breath.   Cardiovascular:  Negative for chest pain and palpitations.  Gastrointestinal:  Positive for constipation. Negative for blood in stool and diarrhea.  Endocrine: Negative for increased urination.  Genitourinary:  Negative for involuntary urination.  Musculoskeletal:  Positive for joint pain, gait problem, joint pain, muscle weakness, morning stiffness and muscle tenderness. Negative for joint swelling, myalgias and myalgias.  Skin:  Positive for color change, rash and sensitivity to sunlight. Negative for hair loss.  Allergic/Immunologic: Positive for susceptible to infections.  Neurological:  Negative for dizziness and headaches.  Hematological:  Negative for swollen glands.  Psychiatric/Behavioral:  Positive for depressed mood and sleep disturbance. The patient is nervous/anxious.     PMFS History:  Patient Active Problem List   Diagnosis Date Noted   Carpal tunnel syndrome, right upper limb 07/09/2023  Peripheral neuropathy 07/09/2023   High risk medication use 05/15/2023   Bilateral primary osteoarthritis of knee 05/15/2023   Chronic low back pain 05/15/2023   Pain in left shoulder 05/15/2023   Moderate obstructive sleep apnea 12/22/2022   Asthma 06/02/2018   Discoid lupus 03/04/2018   HTN (hypertension) 06/20/2015   Type 2 diabetes mellitus without complication (HCC) 06/20/2015    Past Medical History:  Diagnosis Date   Arthritis    Asthma    Diabetes mellitus    Dyslexia    Hypertension    Lupus     Family History  Problem Relation Age of Onset   Breast cancer Maternal Aunt 35       recur @ 22   Past Surgical History:  Procedure Laterality Date   BREAST BIOPSY Right 2022   benign   FOOT SURGERY  01/27/2011   right foot for ganglion cyst   OOPHORECTOMY     Social History   Social History Narrative   Not on  file   Immunization History  Administered Date(s) Administered   Influenza,inj,Quad PF,6+ Mos 12/16/2017, 12/01/2018   Influenza-Unspecified 12/23/2022   Pneumococcal Polysaccharide-23 12/01/2018   Unspecified SARS-COV-2 Vaccination 12/23/2022     Objective: Vital Signs: BP 108/65 (BP Location: Right Arm, Patient Position: Sitting, Cuff Size: Normal)   Pulse 67   Resp 16   Ht 5' 5.5 (1.664 m)   Wt 234 lb 12.8 oz (106.5 kg)   LMP 11/23/2011   BMI 38.48 kg/m    Physical Exam Eyes:     Conjunctiva/sclera: Conjunctivae normal.  Cardiovascular:     Rate and Rhythm: Normal rate and regular rhythm.  Pulmonary:     Effort: Pulmonary effort is normal.     Breath sounds: Normal breath sounds.  Lymphadenopathy:     Cervical: No cervical adenopathy.  Skin:    General: Skin is warm and dry.     Comments: Multiple rashes on scalp, around eyes, on left cheek with varying erythema and hyperpigmentation, erythematous patch on right side and more hyperpigmented under left eye Make sure of erythema and hypopigmented skin peeling changes on dorsal side of multiple finger joints worse at PIPs  Neurological:     Mental Status: She is alert.  Psychiatric:        Mood and Affect: Mood normal.      Musculoskeletal Exam:  Left shoulder passive ROM is normal, tenderness, no palpable swelling Elbows full ROM no tenderness or swelling Wrists full ROM no tenderness or swelling Fingers full ROM no tenderness or swelling Low back midline and left sided tenderness just above iliac crest Knees full ROM, patellofemoral crepitus, anterior and joint line tenderness mild, no palpable effusions    Investigation: No additional findings.  Imaging: No results found.  Recent Labs: Lab Results  Component Value Date   WBC 5.0 11/10/2023   HGB 12.3 11/10/2023   PLT 245 11/10/2023   NA 132 (L) 11/10/2023   K 4.6 11/10/2023   CL 96 (L) 11/10/2023   CO2 26 11/10/2023   GLUCOSE 91 11/10/2023   BUN  21 11/10/2023   CREATININE 1.14 (H) 11/10/2023   BILITOT 0.4 11/10/2023   ALKPHOS 82 12/08/2007   AST 16 11/10/2023   ALT 18 11/10/2023   PROT 7.1 11/10/2023   ALBUMIN 3.9 12/08/2007   CALCIUM 9.7 11/10/2023   GFRAA >60 07/08/2016    Speciality Comments: PLQ Eye Exam: Patient has an eye exam scheduled for 05/25/2023 @ [facility]  Call  to fax results  Procedures:  No procedures performed Allergies: Penicillins and Tape   Assessment / Plan:     Visit Diagnoses: Discoid lupus - tacrolimus  for skin rashes. 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 -Continue hydroxychloroquine  200 mg twice daily - Plan to start dapsone 50 mg to improve skin lesion control. - Provided information on dapsone side effects, including diarrhea and rare bone marrow suppression. - Monitor blood cell counts every three months. - Continue daily topical betamethasone.  High risk medication use - hydroxychloroquine  200 mg BID, needs a PLQ eye exam. - Plan: CBC with Differential/Platelet, Comprehensive metabolic panel with GFR, Glucose 6 phosphate dehydrogenase - Checking G6PD enzyme activity screening for continued long-term use of hydroxychloroquine  also starting in combination with dapsone would not be as recommended if there is enzyme deficiency - Checking CBC CMP for medication monitoring  Bilateral primary osteoarthritis of knee - Initiate physical therapy for knees, back, and shoulder.  Carpal tunnel syndrome, right upper limb   Other polyneuropathy - gabapentin  for neuropathic pain management.  Pericardial effusion Small pericardial effusion on CT scan, asymptomatic.   Orders: Orders Placed This Encounter  Procedures   CBC with Differential/Platelet   Comprehensive metabolic panel with GFR   Sedimentation rate   C3 and C4    Glucose 6 phosphate dehydrogenase   Meds ordered this encounter  Medications   hydroxychloroquine  (PLAQUENIL ) 200 MG tablet    Sig: Take 1 tablet (200 mg total) by mouth 2 (two) times daily.    Dispense:  180 tablet    Refill:  1     Follow-Up Instructions: Return in about 3 months (around 02/10/2024) for CCLE on HCQ/dapsone start f/u 3mos.   Lonni LELON Ester, MD  Note - This record has been created using [employer].  Chart creation errors have been sought, but may not always  have been located. Such creation errors do not reflect on  the standard of medical care.

## 2023-11-09 ENCOUNTER — Telehealth (HOSPITAL_BASED_OUTPATIENT_CLINIC_OR_DEPARTMENT_OTHER): Payer: Self-pay

## 2023-11-09 NOTE — Telephone Encounter (Signed)
 Copied from CRM [id number]. Topic: General - Other >> Nov 09, 2023 12:10 PM Whitney O wrote: Reason for CRM: patient is calling because she  was out of town and beth gave me a order for a cpap machine got everything for the machine but not the machine  Please reach out to patient concerning this information  [phone]   Left pt message to notify she should reach out to [facility] to find out about machine. As we show on our end they have received the order.

## 2023-11-10 ENCOUNTER — Ambulatory Visit: Attending: Internal Medicine | Admitting: Internal Medicine

## 2023-11-10 ENCOUNTER — Encounter: Payer: Self-pay | Admitting: Internal Medicine

## 2023-11-10 VITALS — BP 108/65 | HR 67 | Resp 16 | Ht 65.5 in | Wt 234.8 lb

## 2023-11-10 DIAGNOSIS — M170 Bilateral primary osteoarthritis of knee: Secondary | ICD-10-CM

## 2023-11-10 DIAGNOSIS — L930 Discoid lupus erythematosus: Secondary | ICD-10-CM | POA: Diagnosis not present

## 2023-11-10 DIAGNOSIS — G5601 Carpal tunnel syndrome, right upper limb: Secondary | ICD-10-CM

## 2023-11-10 DIAGNOSIS — Z79899 Other long term (current) drug therapy: Secondary | ICD-10-CM | POA: Diagnosis not present

## 2023-11-10 DIAGNOSIS — G6289 Other specified polyneuropathies: Secondary | ICD-10-CM

## 2023-11-10 MED ORDER — HYDROXYCHLOROQUINE SULFATE 200 MG PO TABS: 200.0000 mg | ORAL_TABLET | Freq: Two times a day (BID) | ORAL | 1 refills | Status: AC

## 2023-11-10 NOTE — Patient Instructions (Signed)
 I am checking an additional blood test to make sure you are not at any increased risk of side effects with the medication. Assuming this is normal I plan to add dapsone 50 mg once daily which should help prevent or clear up skin rashes from lupus.

## 2023-11-13 LAB — COMPREHENSIVE METABOLIC PANEL WITH GFR
AG Ratio: 1.6 (calc) (ref 1.0–2.5)
ALT: 18 U/L (ref 6–29)
AST: 16 U/L (ref 10–35)
Albumin: 4.4 g/dL (ref 3.6–5.1)
Alkaline phosphatase (APISO): 86 U/L (ref 37–153)
BUN/Creatinine Ratio: 18 (calc) (ref 6–22)
BUN: 21 mg/dL (ref 7–25)
CO2: 26 mmol/L (ref 20–32)
Calcium: 9.7 mg/dL (ref 8.6–10.4)
Chloride: 96 mmol/L — ABNORMAL LOW (ref 98–110)
Creat: 1.14 mg/dL — ABNORMAL HIGH (ref 0.50–1.05)
Globulin: 2.7 g/dL (ref 1.9–3.7)
Glucose, Bld: 91 mg/dL (ref 65–99)
Potassium: 4.6 mmol/L (ref 3.5–5.3)
Sodium: 132 mmol/L — ABNORMAL LOW (ref 135–146)
Total Bilirubin: 0.4 mg/dL (ref 0.2–1.2)
Total Protein: 7.1 g/dL (ref 6.1–8.1)
eGFR: 55 mL/min/1.73m2 — ABNORMAL LOW (ref >=60)

## 2023-11-13 LAB — C3 AND C4
C3 Complement: 134 mg/dL (ref 83–193)
C4 Complement: 33 mg/dL (ref 15–57)

## 2023-11-13 LAB — CBC WITH DIFFERENTIAL/PLATELET
Absolute Lymphocytes: 2085 {cells}/uL (ref 850–3900)
Absolute Monocytes: 385 {cells}/uL (ref 200–950)
Basophils Absolute: 30 {cells}/uL (ref 0–200)
Basophils Relative: 0.6 %
Eosinophils Absolute: 405 {cells}/uL (ref 15–500)
Eosinophils Relative: 8.1 %
HCT: 37.3 % (ref 35.0–45.0)
Hemoglobin: 12.3 g/dL (ref 11.7–15.5)
MCH: 28.9 pg (ref 27.0–33.0)
MCHC: 33 g/dL (ref 32.0–36.0)
MCV: 87.6 fL (ref 80.0–100.0)
MPV: 9.5 fL (ref 7.5–12.5)
Monocytes Relative: 7.7 %
Neutro Abs: 2095 {cells}/uL (ref 1500–7800)
Neutrophils Relative %: 41.9 %
Platelets: 245 Thousand/uL (ref 140–400)
RBC: 4.26 Million/uL (ref 3.80–5.10)
RDW: 13.8 % (ref 11.0–15.0)
Total Lymphocyte: 41.7 %
WBC: 5 Thousand/uL (ref 3.8–10.8)

## 2023-11-13 LAB — SEDIMENTATION RATE: Sed Rate: 11 mm/h (ref 0–30)

## 2023-11-13 LAB — GLUCOSE 6 PHOSPHATE DEHYDROGENASE: G-6PDH: 16.8 U/g{Hb} (ref 7.0–20.5)

## 2023-11-26 ENCOUNTER — Telehealth: Payer: Self-pay | Admitting: Primary Care

## 2023-11-26 NOTE — Telephone Encounter (Signed)
 Contacted patient to schedule CPAP compliance and patient states she is having a hard time getting the machine to work properly. States there is air coming out. Please contact patient to discuss.

## 2023-11-26 NOTE — Telephone Encounter (Signed)
 Pt notified to reach out to [facility] and have them look at it

## 2023-12-01 ENCOUNTER — Telehealth: Payer: Self-pay | Admitting: *Deleted

## 2023-12-01 DIAGNOSIS — M170 Bilateral primary osteoarthritis of knee: Secondary | ICD-10-CM

## 2023-12-01 DIAGNOSIS — G6289 Other specified polyneuropathies: Secondary | ICD-10-CM

## 2023-12-01 DIAGNOSIS — L930 Discoid lupus erythematosus: Secondary | ICD-10-CM

## 2023-12-01 LAB — GLUCOSE, POCT (MANUAL RESULT ENTRY): POC Glucose: 122 mg/dL — AB (ref 70–99)

## 2023-12-01 NOTE — Congregational Nurse Program (Signed)
  Dept: [phone]   Congregational Nurse Program Note  Date of Encounter: 12/01/2023  Clinic visit to obtain MD clinic appointment information. Checked blood glucose per request, 122 AC lunch, discussed strategies to manage weight. Past Medical History: Past Medical History:  Diagnosis Date   Arthritis    Asthma    Diabetes mellitus    Dyslexia    Hypertension    Lupus     Encounter Details:  Community Questionnaire - 12/01/23 1345       Questionnaire   Ask client: Do you give verbal consent for me to treat you today? Yes    Student Assistance N/A    Location [place]    Encounter Setting CN site    Population Status Unknown   Has own apartment at [facility];Medicare    Insurance/Financial Assistance Referral N/A    Medication N/A    Medical Provider Yes    Screening Referrals Made N/A    Medical Referrals Made N/A    Medical Appointment Completed N/A    CNP Interventions Advocate/Support;Counsel;Educate    Screenings CN Performed Blood Pressure;Blood Glucose    ED Visit Averted N/A    Life-Saving Intervention Made N/A

## 2023-12-01 NOTE — Telephone Encounter (Signed)
 Patient contacted the office requesting a refill on Gabapentin . Patient states she is out of medication. Patient states she has been taking Gabapentin  4 times daily. Patient advised we sent her in a prescription on 10/01/2023 for a 90 day supply. Patient advised instructions were for her to take Gabapentin  3 times daily. Patient advised her next refill is due in October.

## 2023-12-03 MED ORDER — GABAPENTIN 800 MG PO TABS: 800.0000 mg | ORAL_TABLET | Freq: Three times a day (TID) | ORAL | 0 refills | Status: DC

## 2023-12-03 NOTE — Telephone Encounter (Signed)
 I am sending a prescription refill for 210 tablets (70 days) that will go from now till our scheduled follow up in November. I recommend her to decrease the gabapentin  back to 800 mg TID which was the previous prescription. If she saw another provider during this time who told her increase the dosage, she should reach out to them for any additional extra refills needed.

## 2023-12-03 NOTE — Addendum Note (Signed)
 Addended by: JEANNETTA LONNI ORN on: 12/03/2023 08:56 PM   Modules accepted: Orders

## 2023-12-04 NOTE — Telephone Encounter (Signed)
 Patient advised sent a prescription refill for 210 tablets (70 days) that will go from now till our scheduled follow up in November. Dr. Jeannetta recommends to decrease the gabapentin  back to 800 mg TID which was the previous prescription. Patient advised if she saw another provider during this time who told her increase the dosage, she should reach out to them for any additional extra refills needed.

## 2023-12-15 NOTE — Congregational Nurse Program (Signed)
  Dept: [phone]   Congregational Nurse Program Note  Date of Encounter: 12/15/2023  Clinic visit to review medications for lesions related to an outbreak from lupus.  Red raised areas primarily on face, scalp and chest.  Complains of itching in vagina area, educated regarding use of betamethasone and fluocinolone oil and ointment.  Recommended using cool cloth and cool shower, pat dry, not rub then apply ointments are prescribed by MD.  Past Medical History: Past Medical History:  Diagnosis Date   Arthritis    Asthma    Diabetes mellitus    Dyslexia    Hypertension    Lupus     Encounter Details:  Community Questionnaire - 12/15/23 1330       Questionnaire   Ask client: Do you give verbal consent for me to treat you today? Yes    Student Assistance [employer] Nurse    Location Patient Served  [place]    Encounter Setting CN site    Population Status Unknown   Has own apartment at [facility];Medicare    Insurance/Financial Assistance Referral N/A    Medication N/A    Medical Provider Yes    Screening Referrals Made N/A    Medical Referrals Made N/A    Medical Appointment Completed N/A    CNP Interventions Advocate/Support;Counsel;Educate    Screenings CN Performed Blood Pressure;Blood Glucose    ED Visit Averted N/A    Life-Saving Intervention Made N/A

## 2023-12-20 NOTE — Progress Notes (Unsigned)
 Cardiology Office Note   Date:  12/20/2023   ID:  Courtney Payne, DOB [birth year], MRN [id number]  PCP:  Courtney Harlene CROME, NP  Cardiologist:   None Referring:  ***  No chief complaint on file.     History of Present Illness: Courtney Payne is a 58 y.o. female who presents for ***     Past Medical History:  Diagnosis Date   Arthritis    Asthma    Diabetes mellitus    Dyslexia    Hypertension    Lupus     Past Surgical History:  Procedure Laterality Date   BREAST BIOPSY Right 2022   benign   FOOT SURGERY  01/27/2011   right foot for ganglion cyst   OOPHORECTOMY       Current Outpatient Medications  Medication Sig Dispense Refill   acetaminophen  (TYLENOL ) 500 MG tablet Take 650 mg by mouth every 8 (eight) hours as needed.     albuterol  (PROVENTIL ) (2.5 MG/3ML) 0.083% nebulizer solution Take 3 mLs (2.5 mg total) by nebulization every 6 (six) hours as needed for wheezing or shortness of breath. 150 mL 1   albuterol  (VENTOLIN  HFA) 108 (90 Base) MCG/ACT inhaler INHALE 2 PUFFS BY MOUTH EVERY 6 HOURS AS NEEDED FOR SHORTNESS OF BREATH 9 g 4   amLODipine (NORVASC) 5 MG tablet Take 5 mg by mouth daily.     ammonium lactate (LAC-HYDRIN) 12 % lotion apply to feet twice a day (Patient not taking: Reported on 11/10/2023)     aspirin EC 81 MG tablet Take 81 mg by mouth daily.       atorvastatin (LIPITOR) 10 MG tablet Take 10 mg by mouth daily.     augmented betamethasone dipropionate (DIPROLENE-AF) 0.05 % ointment Apply topically.     Blood Glucose Monitoring Suppl (GLUCOCARD EXPRESSION MONITOR) w/Device KIT      buPROPion (WELLBUTRIN XL) 300 MG 24 hr tablet Take 300 mg by mouth daily.     Cholecalciferol 250 MCG (10000 UT) CAPS Take by mouth.     clobetasol ointment (TEMOVATE) 0.05 % Apply to affected skin twice daily for 2 weeks, then 1-2 times a week as needed for itching. Do not apply to face, genitals, or armpits.     clonazePAM (KLONOPIN) 0.5 MG tablet Take 0.5 mg by mouth  daily.     cyclobenzaprine  (FLEXERIL ) 5 MG tablet Take 5 mg by mouth 2 (two) times daily. (Patient not taking: Reported on 11/10/2023)     Docusate Sodium (DSS) 100 MG CAPS Take by mouth.     DULoxetine (CYMBALTA) 30 MG capsule Take by mouth.     DULoxetine (CYMBALTA) 60 MG capsule Take 60 mg by mouth daily.     ferrous sulfate 325 (65 FE) MG EC tablet Take by mouth.     Fluocinolone Acetonide 0.01 % OIL APPLY A THIN LAYER TO BOTH EARS TWICE A DAY FOR 1-2 WEEKS THEN DECREASE TO 2-3 TIMES A WEEK     fluticasone (CUTIVATE) 0.005 % ointment Apply topically.     fluticasone (FLONASE) 50 MCG/ACT nasal spray Nasal for 90 Days     gabapentin  (NEURONTIN ) 800 MG tablet Take 1 tablet (800 mg total) by mouth 3 (three) times daily. 210 tablet 0   glucose blood (CONTOUR TEST) test strip      hydrocortisone 2.5 % ointment Apply to affected area(s) face and neck twice daily for 2 weeks then 1-2 times a week as needed for itching.     hydroxychloroquine  (PLAQUENIL )  200 MG tablet Take 1 tablet (200 mg total) by mouth 2 (two) times daily. 180 tablet 1   hydrOXYzine (ATARAX) 25 MG tablet Take by mouth.     indapamide (LOZOL) 2.5 MG tablet Take 2.5 mg by mouth daily.     Lancets (ONETOUCH DELICA PLUS LANCET30G) MISC for 100 Days     latanoprost (XALATAN) 0.005 % ophthalmic solution 1 drop at bedtime.     losartan (COZAAR) 50 MG tablet Take 1 tablet by mouth daily.     lurasidone (LATUDA) 40 MG TABS tablet Take 40 mg by mouth at bedtime.     metFORMIN (GLUCOPHAGE) 1000 MG tablet Take 1,000 mg by mouth 2 (two) times daily with a meal.       Multiple Vitamin (MULTIVITAMIN) tablet      omeprazole (PRILOSEC) 20 MG capsule Take 20 mg by mouth daily. (Patient not taking: Reported on 11/10/2023)     omeprazole (PRILOSEC) 40 MG capsule Take 40 mg by mouth daily as needed.     OZEMPIC, 0.25 OR 0.5 MG/DOSE, 2 MG/3ML SOPN Inject into the skin. (Patient not taking: Reported on 11/10/2023)     OZEMPIC, 1 MG/DOSE, 4 MG/3ML SOPN  Subcutaneous for 84 Days     STIOLTO RESPIMAT 2.5-2.5 MCG/ACT AERS SMARTSIG:2 Puff(s) Via Inhaler Daily     SYSTANE ULTRA 0.4-0.3 % SOLN Apply 1 drop to eye 3 (three) times daily.     tacrolimus  (PROTOPIC ) 0.1 % ointment Apply topically 2 (two) times daily as needed. (Patient not taking: Reported on 11/10/2023) 100 g 0   traZODone (DESYREL) 50 MG tablet Take 50 mg by mouth at bedtime as needed for sleep. 1/2 tablet prn     TRELEGY ELLIPTA  100-62.5-25 MCG/ACT AEPB Inhale 1 puff into the lungs daily. (Patient not taking: Reported on 11/10/2023) 60 each 5   triamcinolone cream (KENALOG) 0.1 % Apply topically as needed.     No current facility-administered medications for this visit.    Allergies:   Penicillins and Tape    Social History:  The patient  reports that she quit smoking about 3 years ago. Her smoking use included cigarettes. She has been exposed to tobacco smoke. She has never used smokeless tobacco. She reports that she does not currently use alcohol. She reports current drug use. Drug: Marijuana.   Family History:  The patient's ***family history includes Breast cancer (age of onset: 3) in her maternal aunt.    ROS:  Please see the history of present illness.   Otherwise, review of systems are positive for {NONE DEFAULTED:18576}.   All other systems are reviewed and negative.    PHYSICAL EXAM: VS:  LMP 11/23/2011  , BMI There is no height or weight on file to calculate BMI. GENERAL:  Well appearing HEENT:  Pupils equal round and reactive, fundi not visualized, oral mucosa unremarkable NECK:  No jugular venous distention, waveform within normal limits, carotid upstroke brisk and symmetric, no bruits, no thyromegaly LYMPHATICS:  No cervical, inguinal adenopathy LUNGS:  Clear to auscultation bilaterally BACK:  No CVA tenderness CHEST:  Unremarkable HEART:  PMI not displaced or sustained,S1 and S2 within normal limits, no S3, no S4, no clicks, no rubs, *** murmurs ABD:  Flat,  positive bowel sounds normal in frequency in pitch, no bruits, no rebound, no guarding, no midline pulsatile mass, no hepatomegaly, no splenomegaly EXT:  2 plus pulses throughout, no edema, no cyanosis no clubbing SKIN:  No rashes no nodules NEURO:  Cranial nerves II through XII  grossly intact, motor grossly intact throughout [facility]:  Cognitively intact, oriented to person place and time    EKG:        Recent Labs: 11/10/2023: ALT 18; BUN 21; Creat 1.14; Hemoglobin 12.3; Platelets 245; Potassium 4.6; Sodium 132    Lipid Panel No results found for: CHOL, TRIG, HDL, CHOLHDL, VLDL, LDLCALC, LDLDIRECT    Wt Readings from Last 3 Encounters:  11/10/23 234 lb 12.8 oz (106.5 kg)  07/09/23 238 lb (108 kg)  05/15/23 241 lb (109.3 kg)      Other studies Reviewed: Additional studies/ records that were reviewed today include: ***. Review of the above records demonstrates:  Please see elsewhere in the note.  ***   ASSESSMENT AND PLAN:  *** HTN:  ***  DM:  ***    Current medicines are reviewed at length with the patient today.  The patient {ACTIONS; HAS/DOES NOT HAVE:19233} concerns regarding medicines.  The following changes have been made:  {PLAN; NO CHANGE:13088:s}  Labs/ tests ordered today include: *** No orders of the defined types were placed in this encounter.    Disposition:   FU with ***    Signed, Lynwood Schilling, MD  12/20/2023 9:38 PM    [facility]

## 2023-12-23 ENCOUNTER — Ambulatory Visit: Attending: Internal Medicine | Admitting: Cardiology

## 2023-12-23 ENCOUNTER — Encounter: Payer: Self-pay | Admitting: Cardiology

## 2023-12-23 VITALS — BP 112/68 | HR 74 | Ht 65.5 in | Wt 233.0 lb

## 2023-12-23 DIAGNOSIS — I2510 Atherosclerotic heart disease of native coronary artery without angina pectoris: Secondary | ICD-10-CM | POA: Diagnosis not present

## 2023-12-23 DIAGNOSIS — E118 Type 2 diabetes mellitus with unspecified complications: Secondary | ICD-10-CM

## 2023-12-23 DIAGNOSIS — I1A0 Resistant hypertension: Secondary | ICD-10-CM | POA: Diagnosis not present

## 2023-12-23 NOTE — Patient Instructions (Signed)
 Medication Instructions:  Your physician recommends that you continue on your current medications as directed. Please refer to the Current Medication list given to you today.  *If you need a refill on your cardiac medications before your next appointment, please call your pharmacy*  Lab Work: NONE If you have labs (blood work) drawn today and your tests are completely normal, you will receive your results only by: MyChart Message (if you have MyChart) OR A paper copy in the mail If you have any lab test that is abnormal or we need to change your treatment, we will call you to review the results.  Testing/Procedures: PET/CT Stress Test  Follow-Up: At [facility], you and your health needs are our priority.  As part of our continuing mission to provide you with exceptional heart care, our providers are all part of one team.  This team includes your primary Cardiologist (physician) and Advanced Practice Providers or APPs (Physician Assistants and Nurse Practitioners) who all work together to provide you with the care you need, when you need it.  Your next appointment:   As needed  Provider:   [place], MD  We recommend signing up for the patient portal called MyChart.  Sign up information is provided on this After Visit Summary.  MyChart is used to connect with patients for Virtual Visits (Telemedicine).  Patients are able to view lab/test results, encounter notes, upcoming appointments, etc.  Non-urgent messages can be sent to your provider as well.   To learn more about what you can do with MyChart, go to [email or web address].   Other Instructions    Please report to Radiology at the [facility] Main Entrance 30 minutes early for your test.  [address] [place], [place] [address]                         OR   Please report to Radiology at [facility] Main Entrance, medical mall, 30 mins prior to your test.  [address]  [place], [place]  How to Prepare for Your Cardiac PET/CT Stress Test:  Nothing to eat or drink, except water, 3 hours prior to arrival time.  NO caffeine/decaffeinated products, or chocolate 12 hours prior to arrival. (Please note decaffeinated beverages (teas/coffees) still contain caffeine).  If you have caffeine within 12 hours prior, the test will need to be rescheduled.  Medication instructions: Do not take erectile dysfunction medications for 72 hours prior to test (sildenafil, tadalafil) Do not take nitrates (isosorbide mononitrate, Ranexa) the day before or day of test Do not take tamsulosin the day before or morning of test Hold theophylline containing medications for 12 hours. Hold Dipyridamole 48 hours prior to the test.  Diabetic Preparation: If able to eat breakfast prior to 3 hour fasting, you may take all medications, including your insulin. Do not worry if you miss your breakfast dose of insulin - start at your next meal. If you do not eat prior to 3 hour fast-Hold all diabetes (oral and insulin) medications. Patients who wear a continuous glucose monitor MUST remove the device prior to scanning.  You may take your remaining medications with water.  NO perfume, cologne or lotion on chest or abdomen area. FEMALES - Please avoid wearing dresses to this appointment.  Total time is 1 to 2 hours; you may want to bring reading material for the waiting time.  IF YOU THINK YOU MAY BE PREGNANT, OR ARE NURSING  PLEASE INFORM THE TECHNOLOGIST.  In preparation for your appointment, medication and supplies will be purchased.  Appointment availability is limited, so if you need to cancel or reschedule, please call the Radiology Department Scheduler at [phone] 24 hours in advance to avoid a cancellation fee of $100.00  What to Expect When you Arrive:  Once you arrive and check in for your appointment, you will be taken to a preparation room within the Radiology Department.   A technologist or Nurse will obtain your medical history, verify that you are correctly prepped for the exam, and explain the procedure.  Afterwards, an IV will be started in your arm and electrodes will be placed on your skin for EKG monitoring during the stress portion of the exam. Then you will be escorted to the PET/CT scanner.  There, staff will get you positioned on the scanner and obtain a blood pressure and EKG.  During the exam, you will continue to be connected to the EKG and blood pressure machines.  A small, safe amount of a radioactive tracer will be injected in your IV to obtain a series of pictures of your heart along with an injection of a stress agent.    After your Exam:  It is recommended that you eat a meal and drink a caffeinated beverage to counter act any effects of the stress agent.  Drink plenty of fluids for the remainder of the day and urinate frequently for the first couple of hours after the exam.  Your doctor will inform you of your test results within 7-10 business days.  For more information and frequently asked questions, please visit our website: [email or web address]  For questions about your test or how to prepare for your test, please call: Cardiac Imaging Nurse Navigators Office: [phone]

## 2023-12-25 ENCOUNTER — Ambulatory Visit: Payer: Self-pay | Admitting: Cardiology

## 2023-12-31 ENCOUNTER — Telehealth: Payer: Self-pay

## 2023-12-31 NOTE — Telephone Encounter (Signed)
 I called and spoke to pt. We cancelled her appt tomorrow with Landry Ferrari, NP due to needing more time to use her CPAP to show compliance. Pt is now scheduled for 02-03-24. NFN

## 2024-01-01 ENCOUNTER — Ambulatory Visit: Admitting: Primary Care

## 2024-01-19 NOTE — Congregational Nurse Program (Signed)
  Dept: [phone]   Congregational Nurse Program Note  Date of Encounter: 01/19/2024  Resident seen at MD visit diagnosed with Stage 3A kidney disease, came to care and treatment.  Reviewed dietary choice to limit and prevent further kidney damage, resident to keep a food diary for one week to be reviewed at next clinic visit.  Past Medical History: Past Medical History:  Diagnosis Date   Arthritis    Asthma    Diabetes mellitus    Dyslexia    Hypertension    Lupus     Encounter Details:  Community Questionnaire - 01/19/24 1412       Questionnaire   Ask client: Do you give verbal consent for me to treat you today? 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

## 2024-01-20 ENCOUNTER — Encounter: Payer: Self-pay | Admitting: Primary Care

## 2024-01-20 ENCOUNTER — Ambulatory Visit: Admitting: Primary Care

## 2024-01-20 VITALS — BP 128/64 | HR 68 | Temp 96.4°F | Ht 65.5 in | Wt 230.6 lb

## 2024-01-20 DIAGNOSIS — J45909 Unspecified asthma, uncomplicated: Secondary | ICD-10-CM

## 2024-01-20 DIAGNOSIS — G4733 Obstructive sleep apnea (adult) (pediatric): Secondary | ICD-10-CM

## 2024-01-20 MED ORDER — TRELEGY ELLIPTA 100-62.5-25 MCG/ACT IN AEPB: 1.0000 | INHALATION_SPRAY | Freq: Every day | RESPIRATORY_TRACT | 5 refills | Status: AC

## 2024-01-20 NOTE — Patient Instructions (Signed)
  VISIT SUMMARY: Today, you had a follow-up appointment to discuss your asthma, sleep apnea, lupus, and kidney health. We reviewed your current medications and management strategies for each condition.  YOUR PLAN: -MODERATE PERSISTENT ASTHMA: Moderate persistent asthma means you have asthma symptoms more than twice a week but not daily. You will continue using Trelegy 100 micrograms once daily and albuterol  as needed. We will discontinue Stiolto since Trelegy is working better for you.  -OBSTRUCTIVE SLEEP APNEA: Obstructive sleep apnea is a condition where your breathing stops and starts during sleep. You will continue using your CPAP machine with the Airfit P10 nasal pillow mask. To improve compliance, try setting an alarm to remind you to put on the mask if you fall asleep without it. Aim to use the CPAP machine at least 70% of nights for at least 4 hours each night.  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 You will continue taking Plaquenil  200 mg twice daily and using topical ointments as prescribed. If your symptoms persist or worsen, consider following up with your rheumatologist.  -CHRONIC KIDNEY DISEASE STAGE 3A: Chronic kidney disease stage 3A means your kidneys are moderately damaged. Continue following a Mediterranean diet to help manage your kidney health and prevent further progression.  -PERICARDIAL EFFUSION: A pericardial effusion is a buildup of fluid around the heart. You are scheduled for a cardiac stress test as part of your ongoing evaluation.  INSTRUCTIONS: Please continue with your current medications and management strategies as discussed. Remember to set an alarm to remind you to use your CPAP machine. Follow up with your rheumatologist if your lupus symptoms persist or worsen. Proceed with your scheduled cardiac stress test.   Follow-up 4-6 week  virtual visit for CPAP compliance with Landry NP

## 2024-01-20 NOTE — Progress Notes (Signed)
 @Patient  ID: Courtney Payne, female    DOB: 05-Mar-1966, 58 y.o.   MRN: [id number]  No chief complaint on file.   Referring provider: Sharyne Harlene CROME, NP  HPI: 58 year old female, current everyday smoker.  Past medical history significant for hypertension, asthma, sleep apnea, type 2 diabetes, discoid lupus.  Previous [facility] pulmonary encounter:  12/22/2022 Patient presents today for sleep consult. She was followed by pulmonary with [facility] for hx asthma and moderate OSA. She was last seen by them in July 2021.   She comes in to our clinic today for sleep consult. Last sleep study 05/2018 showed mild OSA with AHI of 13.2/hour. She reports seeing a slight improvement in sleep quality and energy level with PAP therapy. She has not worn CPAP in about 2 years, she had a hard time tolerating due to anxiety. She did better with nasal mask. She has an airsense 10 machine which is about 39-62 years old. Download not avaialble, pressure settings 5-20cm h20. She reports 20-30lbs weight loss since original sleep study. Unsure if she snores. She very occasionally with wake herself up snoring/gasping/choking. Epworth 6/24. No concern for narcolepsy, cataplexy or sleep walking.   Chronic dyspnea symptoms. She takes [employer]. She uses Albuterol  2-3 times a week on average. Heat/humidity will flare up her symptoms. She is on Plaquenil  two times daily, awaiting an appointment with rheumatology.   Sleep questionnaire Symptoms- unsure  Prior sleep study- 2021 with [facility] in [place]  Bedtime- 11pm-1am Time to fall asleep- 1-2 hours  Nocturnal awakenings- 1-2 times  Out of bed/start of day- 7:30-8:30am  Weight changes- 30 lbs  Do you operate heavy machinery- no Do you currently wear CPAP- unsure  Do you current wear oxygen- no Epworth- 6    04/20/2023 Discussed the use of AI scribe software for clinical note transcription with the patient, who gave verbal consent  to proceed.  History of Present Illness   The patient, with a history of asthma, sleep apnea, and a former smoker, presents for a follow-up visit. They report a history of sleep apnea, previously managed with a CPAP machine, which they stopped using approximately two years ago due to difficulty tolerating it. They have since lost 20-30 pounds and recently underwent a repeat sleep study. The repeat study in October 2024 showed mild sleep apnea with nine apneic events per hour. She spent 11 minutes with an oxygen level less than 88%. This is an improvement from a previous study in 2020, which showed 13 apneic events per hour.  The patient reports occasional snoring and has woken up gasping or choking on a few occasions. They also describe restless sleep, often moving around and even falling out of bed a couple of times. They express a desire to lose more weight, with a goal weight of 210 pounds, down from their current weight of 241 pounds.  In terms of their asthma, the patient reports occasional wheezing and shortness of breath, particularly when walking uphill. They use a rescue inhaler approximately three times a week when out and about, and express a desire for a nebulizer for use during acute episodes such as upper respiratory infections or bronchitis. They are currently on Trelegy 100mcg for their asthma.     01/20/2024- Interim hx  Discussed the use of AI scribe software for clinical note transcription with the patient, who gave verbal consent to proceed. History of Present Illness  Courtney Payne is a 58 year old female with moderate  persistent asthma and mild obstructive sleep apnea who presents for follow-up.  She uses Trelegy 100mcg once daily for asthma management, finding it more effective and with fewer side effects compared to Stiolto. Albuterol  is used as needed, approximately two to four times a week, especially during exertion, such as running for a bus. Over the past four weeks,  asthma has caused some limitations in daily activities, with shortness of breath occurring three to six times a week. She occasionally wakes up at night due to asthma symptoms, about once or twice a month.  For her mild obstructive sleep apnea, she uses a CPAP machine with an Airfit P10 nasal pillow mask, which she finds comfortable. She received a new CPAP machine in August but has inconsistent use, often forgetting to put it on before falling asleep. When used, she sleeps well, achieving nearly eight hours of sleep.  She has a history of lupus, currently experiencing a flare-up affecting her skin, causing arthritis and headaches. She takes Plaquenil  200 mg twice daily and uses topical ointments for skin symptoms. Concerned about her kidney health, currently at stage 3A, she follows a Mediterranean diet. A CT scan in March 2025 showed nodular densities in the left upper lobe, which resolved by June, and a small pericardial effusion was noted.  Imaging: 06/12/23 CT chest  w contrast>> subtle nodular densities left upper lobe.  Findings could correlate with subtle pneumonitis.  09/08/23 CT chest wo contrast >> resolution of previously nodules, no acute airspace disease. No suspicious pulmonary nodules. Incidental findings Small pericardial effusion. Multi-vessel coronary vascular Calcification.   Sleep testing: 01/21/23 HST >> Mild OSA, AHI 9.1/hour with SpO2 low 85% (baseline 99%). Patient spent 11 mins with SpO2 <88%         Allergies  Allergen Reactions   Penicillins Hives   Tape Rash    Due to lupus    Immunization History  Administered Date(s) Administered   Influenza,inj,Quad PF,6+ Mos 12/16/2017, 12/01/2018   Influenza-Unspecified 12/23/2022   Pneumococcal Polysaccharide-23 12/01/2018   Unspecified SARS-COV-2 Vaccination 12/23/2022    Past Medical History:  Diagnosis Date   Arthritis    Asthma    Diabetes mellitus    Dyslexia    Hypertension    Lupus     Tobacco  History: Social History   Tobacco Use  Smoking Status Former   Current packs/day: 0.00   Types: Cigarettes   Quit date: 2022   Years since quitting: 3.8   Passive exposure: Past  Smokeless Tobacco Never  Tobacco Comments   Quit over a year ago   Counseling given: Not Answered Tobacco comments: Quit over a year ago   Outpatient Medications Prior to Visit  Medication Sig Dispense Refill   acetaminophen  (TYLENOL ) 500 MG tablet Take 650 mg by mouth every 8 (eight) hours as needed.     albuterol  (PROVENTIL ) (2.5 MG/3ML) 0.083% nebulizer solution Take 3 mLs (2.5 mg total) by nebulization every 6 (six) hours as needed for wheezing or shortness of breath. 150 mL 1   albuterol  (VENTOLIN  HFA) 108 (90 Base) MCG/ACT inhaler INHALE 2 PUFFS BY MOUTH EVERY 6 HOURS AS NEEDED FOR SHORTNESS OF BREATH 9 g 4   amLODipine (NORVASC) 5 MG tablet Take 5 mg by mouth daily.     ammonium lactate (LAC-HYDRIN) 12 % lotion apply to feet twice a day (Patient not taking: Reported on 12/23/2023)     aspirin EC 81 MG tablet Take 81 mg by mouth daily.  atorvastatin (LIPITOR) 10 MG tablet Take 10 mg by mouth daily.     augmented betamethasone dipropionate (DIPROLENE-AF) 0.05 % ointment Apply topically.     Blood Glucose Monitoring Suppl (GLUCOCARD EXPRESSION MONITOR) w/Device KIT      buPROPion (WELLBUTRIN XL) 300 MG 24 hr tablet Take 300 mg by mouth daily.     Cholecalciferol 250 MCG (10000 UT) CAPS Take by mouth.     clonazePAM (KLONOPIN) 0.5 MG tablet Take 0.5 mg by mouth daily.     cyclobenzaprine  (FLEXERIL ) 5 MG tablet Take 5 mg by mouth 2 (two) times daily. (Patient not taking: Reported on 12/23/2023)     Docusate Sodium (DSS) 100 MG CAPS Take by mouth.     DULoxetine (CYMBALTA) 30 MG capsule Take by mouth.     DULoxetine (CYMBALTA) 60 MG capsule Take 60 mg by mouth daily. (Patient not taking: Reported on 12/23/2023)     ferrous sulfate 325 (65 FE) MG EC tablet Take by mouth.     Fluocinolone Acetonide 0.01  % OIL APPLY A THIN LAYER TO BOTH EARS TWICE A DAY FOR 1-2 WEEKS THEN DECREASE TO 2-3 TIMES A WEEK     fluticasone (CUTIVATE) 0.005 % ointment Apply topically.     fluticasone (FLONASE) 50 MCG/ACT nasal spray Nasal for 90 Days     gabapentin  (NEURONTIN ) 800 MG tablet Take 1 tablet (800 mg total) by mouth 3 (three) times daily. 210 tablet 0   glucose blood (CONTOUR TEST) test strip      hydrocortisone 2.5 % ointment Apply to affected area(s) face and neck twice daily for 2 weeks then 1-2 times a week as needed for itching.     hydroxychloroquine  (PLAQUENIL ) 200 MG tablet Take 1 tablet (200 mg total) by mouth 2 (two) times daily. 180 tablet 1   hydrOXYzine (ATARAX) 25 MG tablet Take by mouth.     indapamide (LOZOL) 2.5 MG tablet Take 2.5 mg by mouth daily.     Lancets (ONETOUCH DELICA PLUS LANCET30G) MISC for 100 Days     latanoprost (XALATAN) 0.005 % ophthalmic solution 1 drop at bedtime.     losartan (COZAAR) 50 MG tablet Take 1 tablet by mouth daily.     lurasidone (LATUDA) 40 MG TABS tablet Take 40 mg by mouth at bedtime.     metFORMIN (GLUCOPHAGE) 1000 MG tablet Take 1,000 mg by mouth 2 (two) times daily with a meal.       Multiple Vitamin (MULTIVITAMIN) tablet      omeprazole (PRILOSEC) 40 MG capsule Take 40 mg by mouth daily as needed.     OZEMPIC, 0.25 OR 0.5 MG/DOSE, 2 MG/3ML SOPN Inject into the skin. (Patient not taking: Reported on 11/10/2023)     OZEMPIC, 1 MG/DOSE, 4 MG/3ML SOPN Subcutaneous for 84 Days     STIOLTO RESPIMAT 2.5-2.5 MCG/ACT AERS SMARTSIG:2 Puff(s) Via Inhaler Daily     SYSTANE ULTRA 0.4-0.3 % SOLN Apply 1 drop to eye 3 (three) times daily.     tacrolimus  (PROTOPIC ) 0.1 % ointment Apply topically 2 (two) times daily as needed. (Patient not taking: Reported on 12/23/2023) 100 g 0   traZODone (DESYREL) 50 MG tablet Take 50 mg by mouth at bedtime as needed for sleep. 1/2 tablet prn     TRELEGY ELLIPTA  100-62.5-25 MCG/ACT AEPB Inhale 1 puff into the lungs daily. (Patient not  taking: Reported on 12/23/2023) 60 each 5   No facility-administered medications prior to visit.    Review of Systems  Review  of Systems  Constitutional: Negative.  Negative for fatigue.  Respiratory: Negative.  Negative for cough, shortness of breath and wheezing.   Psychiatric/Behavioral:  Negative for sleep disturbance.     Physical Exam  LMP 11/23/2011  Physical Exam Constitutional:      General: She is not in acute distress.    Appearance: Normal appearance. She is well-developed. She is not ill-appearing.  HENT:     Head: Normocephalic and atraumatic.     Mouth/Throat:     Mouth: Mucous membranes are moist.     Pharynx: Oropharynx is clear.  Eyes:     Pupils: Pupils are equal, round, and reactive to light.  Cardiovascular:     Rate and Rhythm: Normal rate and regular rhythm.     Heart sounds: Normal heart sounds. No murmur heard. Pulmonary:     Effort: Pulmonary effort is normal. No respiratory distress.     Breath sounds: Normal breath sounds. No wheezing or rhonchi.  Musculoskeletal:        General: Normal range of motion.     Cervical back: Normal range of motion and neck supple.  Skin:    General: Skin is warm and dry.     Findings: No erythema or rash.  Neurological:     General: No focal deficit present.     Mental Status: She is alert and oriented to person, place, and time. Mental status is at baseline.  Psychiatric:        Mood and Affect: Mood normal.        Behavior: Behavior normal.        Thought Content: Thought content normal.        Judgment: Judgment normal.     Lab Results:  CBC    Component Value Date/Time   WBC 5.0 11/10/2023 1146   RBC 4.26 11/10/2023 1146   HGB 12.3 11/10/2023 1146   HCT 37.3 11/10/2023 1146   PLT 245 11/10/2023 1146   MCV 87.6 11/10/2023 1146   MCH 28.9 11/10/2023 1146   MCHC 33.0 11/10/2023 1146   RDW 13.8 11/10/2023 1146   LYMPHSABS 4.3 (H) 07/08/2016 1801   MONOABS 0.5 07/08/2016 1801   EOSABS 405  11/10/2023 1146   BASOSABS 30 11/10/2023 1146    BMET    Component Value Date/Time   NA 132 (L) 11/10/2023 1146   K 4.6 11/10/2023 1146   CL 96 (L) 11/10/2023 1146   CO2 26 11/10/2023 1146   GLUCOSE 91 11/10/2023 1146   BUN 21 11/10/2023 1146   CREATININE 1.14 (H) 11/10/2023 1146   CALCIUM 9.7 11/10/2023 1146   GFRNONAA 56 (L) 07/08/2016 1801   GFRAA >60 07/08/2016 1801    BNP No results found for: BNP  ProBNP No results found for: PROBNP  Imaging: No results found.   Assessment & Plan:   No problem-specific Assessment & Plan notes found for this encounter.   1. Asthma, unspecified asthma severity, unspecified whether complicated, unspecified whether persistent (Primary)  2. Moderate obstructive sleep apnea   Assessment and Plan Assessment & Plan Moderate persistent asthma Moderate persistent asthma is currently well controlled with Trelegy. ACT 17. Prefers Trelegy over [employer] due to better bronchodilation and fewer side effects.  - Discontinue Stiolto. - Continue Trelegy 100 micrograms once daily. - Use albuterol  every 6 hours as needed for acute symptoms.  Obstructive sleep apnea Mild obstructive sleep apnea, previously moderate, with improved control on CPAP. Prefers Airfit P10 nasal pillow mask due to anxiety with other masks.  Compliance with CPAP is suboptimal, with occasional non-use. Current pressure 5-12cm h20 with residual AHI score is 1.9, indicating well-controlled apnea. - Provide Airfit P10 nasal pillow mask in size XS. - Encourage CPAP use every night, aiming for compliance of 70% of nights with at least 4 hours of use. - Suggest setting an alarm to remind her to put on the CPAP mask if she falls asleep without it. - FU in 6 weeks for compliance check-patient will be moving to [place]  at some point   Systemic lupus erythematosus  Systemic lupus erythematosus with cutaneous involvement, currently experiencing a flare-up. Managed with  Plaquenil  and topical ointments. Reports arthritis and headaches. - Continue Plaquenil  200 mg by mouth twice daily. - Use topical ointments as prescribed by rheumatologist and dermatologist. - Encourage follow-up with rheumatologist if symptoms persist or worsen.  Chronic kidney disease stage 3A Chronic kidney disease stage 3A, managed with dietary changes, specifically a Mediterranean diet, to avoid progression to dialysis. Family history of renal disease noted.  Pericardial effusion Previously identified small pericardial effusion on CT imaging with follow-up in cardiology. Scheduled for a cardiac stress test as part of ongoing evaluation. - Proceed with scheduled cardiac stress test.   Almarie LELON Ferrari, NP 01/20/2024

## 2024-01-25 ENCOUNTER — Encounter (HOSPITAL_COMMUNITY): Payer: Self-pay

## 2024-01-25 ENCOUNTER — Other Ambulatory Visit (HOSPITAL_COMMUNITY): Payer: Self-pay | Admitting: *Deleted

## 2024-01-25 DIAGNOSIS — I25119 Atherosclerotic heart disease of native coronary artery with unspecified angina pectoris: Secondary | ICD-10-CM

## 2024-01-26 ENCOUNTER — Telehealth (HOSPITAL_COMMUNITY): Payer: Self-pay | Admitting: Emergency Medicine

## 2024-01-26 NOTE — Telephone Encounter (Signed)
 Reaching out to patient to offer assistance regarding upcoming cardiac imaging study; pt verbalizes understanding of appt date/time, parking situation and where to check in, pre-test NPO status and medications ordered, and verified current allergies; name and call back number provided for further questions should they arise Rockwell Alexandria RN Navigator Cardiac Imaging [facility] [phone] office [phone] cell

## 2024-01-27 ENCOUNTER — Ambulatory Visit (HOSPITAL_COMMUNITY)
Admission: RE | Admit: 2024-01-27 | Discharge: 2024-01-27 | Disposition: A | Discharge status: Home / Self Care | Source: Ambulatory Visit | Attending: Cardiology | Admitting: Cardiology

## 2024-01-27 DIAGNOSIS — I2510 Atherosclerotic heart disease of native coronary artery without angina pectoris: Secondary | ICD-10-CM | POA: Insufficient documentation

## 2024-01-27 LAB — NM PET CT CARDIAC PERFUSION MULTI W/ABSOLUTE BLOODFLOW
LV dias vol: 97 mL (ref 46–106)
LV sys vol: 33 mL
MBFR: 2.63
Nuc Rest EF: 66 %
Nuc Stress EF: 72 %
Peak HR: 71 {beats}/min
Rest HR: 59 {beats}/min
Rest MBF: 0.81 ml/g/min
Rest Nuclear Isotope Dose: 27 mCi
ST Depression (mm): 0 mm
Stress MBF: 2.13 ml/g/min
Stress Nuclear Isotope Dose: 27 mCi

## 2024-01-27 MED ORDER — RUBIDIUM RB82 GENERATOR (RUBYFILL)
27.0400 | PACK | Freq: Once | INTRAVENOUS | Status: AC
  Administered 2024-01-27: 27.04 via INTRAVENOUS

## 2024-01-27 MED ORDER — REGADENOSON 0.4 MG/5ML IV SOLN
INTRAVENOUS | Status: AC
Start: 2024-01-27 — End: 2024-01-27
  Filled 2024-01-27: qty 5

## 2024-01-27 MED ORDER — REGADENOSON 0.4 MG/5ML IV SOLN
0.4000 mg | Freq: Once | INTRAVENOUS | Status: AC
  Administered 2024-01-27: 0.4 mg via INTRAVENOUS

## 2024-01-27 NOTE — Progress Notes (Signed)
 Pt tolerated lexiscan. C/o nausea and dizziness, resolved by end of scan. Given PO caffeine and able to ambulate back to waiting room.

## 2024-01-28 NOTE — Progress Notes (Signed)
 Office Visit Note  Patient: Courtney Payne             Date of Birth: 04-07-1965           MRN: [id number]             PCP: [facility] [address] Referring: Sharyne Harlene CROME, NP Visit Date: 02/10/2024   Subjective:  Lupus (Head is flared and itching)   Discussed the use of AI scribe software for clinical note transcription with the patient, who gave verbal consent to proceed.  History of Present Illness   Courtney Payne is a 58 y.o. 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    She experiences persistent itching, particularly on her back and scalp, describing the sensation as 'itching, itching, itching'. She has been using topical steroids, specifically betamethasone on her head and another cream on her face and ears, but finds them not very effective. She has been prescribed tacrolimus , but her insurance did not cover it, so she has not been able to use it. Currently, she is only taking Plaquenil  and using topical steroids.  She has a history of discoid lupus, diagnosed in 2015, with symptoms including rashes and scarring alopecia. She reports rashes and scarring alopecia, particularly on her scalp and ears. No new rashes outside of the scalp since the last visit.  She reports dry skin, especially at the elbows, and occasional pain in her fingers during winter weather. No lymph node swelling, lumps, or bumps in her neck. She has not been sick with any new illnesses since the last visit.  She follows a Mediterranean diet and avoids processed foods.       Previous HPI 11/10/2023 Courtney Payne is a 58 y.o. 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    She experiences recurrent skin rashes characterized by itching and peeling of the hands, with changes in skin color. Topical steroids are used daily to manage these symptoms, and without medication, her skin becomes scaly.  Betamethasone is applied to her head, and oil is used for her ears, which has helped alleviate some issues.   She takes Plaquenil  (hydroxychloroquine ) daily, which has provided some relief, but symptoms persist, particularly with sweating. Excessive sweating exacerbates the itching, and she experiences cold sweats at night and frequent hot flashes, leading to excessive sweating during the day.   She reports persistent pain in her shoulder, hip, and knees, attributing it to arthritis. Additionally, she notes a new sore that was burning in the morning.   Her family history includes lupus and kidney issues. Previous blood tests and a CT scan earlier in the year were performed, and a small amount of fluid around her heart was noted.         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   She has osteoarthritis affecting her knees, left shoulder, and back, with the right knee being more symptomatic. The pain is associated with bone spurs and wear and tear in the joints, and she has been informed of more cartilage thinning in the right knee. She manages the pain with gabapentin  for general pain relief.   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 She uses hydroxychloroquine  and topical steroid creams, including fluocinolone for her face and betamethasone for her head. The rashes tend to recur  frequently.   She has a history of carpal tunnel syndrome in her right wrist and neuropathy in her hands, experiencing numbness and pain, particularly at night. 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   She has mild degenerative disc disease in her back, with narrowing of the space between the bones and bone spurs, leading to sciatica with pain radiating down her legs.   She is concerned about her kidney  health due to a family history of kidney disease, as her sisters and mother are kidney patients. However, her kidney function is normal and there is no protein in her urine.   She mentions a [place] 30 [place] [address], attributing it to dietary changes while living with her grandchildren. She is motivated to lose this weight.       Previous HPI 05/15/23 Courtney Payne is a 58 year old female with discoid lupus here for evaluation management of her rashes also with joint pain in multiple areas.   She experiences significant knee pain, describing her knees as 'terrible' and expressing concern about the condition of her kneecaps. She has not had recent imaging but had x-rays of both knees in 2020 when she applied for social security disability. 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 She is not currently taking any medication specifically for pain but uses gabapentin  and medical marijuana, noting that gabapentin  is largely ineffective. She also takes duloxetine, which was recently increased to 90 mg daily, to help with sleep and pain management.   She was diagnosed with discoid lupus in 2015 by Dr. Jackquline Burrow with biopsy. She has been on hydroxychloroquine  since then. She experiences painful skin rashes on her face and back, which are sometimes painful to the touch. She uses topical ointments, as she had a severe reaction to creams in the past. The rashes are currently inflamed and painful. No rashes on her arms or legs.  Review of records there was consideration of alternate DMARD such as methotrexate or thalidomide but she never started on treatment with these and does not recall these drugs by name.   She experiences significant hair loss, which she attributes to her lupus, and is distressed over being bald, linking it to her depression. She has not been on any other oral  medications for lupus besides hydroxychloroquine .   She reports eye trouble and is scheduled for an eye exam on March 3rd. No floaters or visual field issues. She is aware of the potential side effects of hydroxychloroquine  on the eyes and has regular screenings that have been normal.   She describes pain in her shoulder, particularly after a recent fall, and notes that it is difficult to move her arm backward.  Notices increased pain in this area if she rolls onto her left side and if she tries to fall asleep lying on her back.  She has not had any imaging of her shoulder.   She reports nerve damage in her hand which causes weakness and pain, particularly in her right hand. Her fingers hurt and sometimes she cannot cut things due to pain.  She had a nerve conduction study in September 2024 indicating moderate right median nerve impingement but there was also evidence of significant peripheral neuropathy.  She has not pursued any definitive management  for this after the testing.     11/2022 HAV/HBV/HCV neg   08/2020 TPMT Genotype 1*/1*   09/2019 ANA neg dsDNA, RNP, Sm, SSA, SSB neg   Review of Systems  Constitutional:  Positive for fatigue.  HENT:  Positive for mouth dryness. Negative for mouth sores.   Eyes:  Positive for dryness.  Respiratory:  Positive for shortness of breath.   Cardiovascular:  Negative for chest pain and palpitations.  Gastrointestinal:  Positive for constipation. Negative for blood in stool and diarrhea.  Endocrine: Negative for increased urination.  Genitourinary:  Negative for involuntary urination.  Musculoskeletal:  Positive for joint pain, gait problem, joint pain, myalgias, muscle weakness, morning stiffness and myalgias. Negative for joint swelling and muscle tenderness.  Skin:  Positive for rash and sensitivity to sunlight. Negative for color change and hair loss.  Allergic/Immunologic: Negative for susceptible to infections.  Neurological:  Positive for  dizziness and headaches.  Hematological:  Negative for swollen glands.  Psychiatric/Behavioral:  Positive for depressed mood. Negative for sleep disturbance. The patient is nervous/anxious.     PMFS History:  Patient Active Problem List   Diagnosis Date Noted   Carpal tunnel syndrome, right upper limb 07/09/2023   Peripheral neuropathy 07/09/2023   High risk medication use 05/15/2023   Bilateral primary osteoarthritis of knee 05/15/2023   Chronic low back pain 05/15/2023   Pain in left shoulder 05/15/2023   Moderate obstructive sleep apnea 12/22/2022   Asthma 06/02/2018   Discoid lupus 03/04/2018   HTN (hypertension) 06/20/2015   Type 2 diabetes mellitus without complication 06/20/2015    Past Medical History:  Diagnosis Date   Arthritis    Asthma    Diabetes mellitus    Dyslexia    Hypertension    Lupus    Stage 3 chronic kidney disease (HCC)     Family History  Problem Relation Age of Onset   Heart failure Mother    Chronic Renal Failure Mother    Breast cancer Maternal Aunt 35       recur @ 49   Past Surgical History:  Procedure Laterality Date   BREAST BIOPSY Right 2022   benign   FOOT SURGERY  01/27/2011   right foot for ganglion cyst   OOPHORECTOMY     Social History   Social History Narrative   Lives alone.  Two children.  Retired LAWYER.     Immunization History  Administered Date(s) Administered   Influenza, Seasonal, Injecte, Preservative Fre 12/18/2023   Influenza,inj,Quad PF,6+ Mos 12/16/2017, 12/01/2018   Influenza-Unspecified 12/23/2022   Pneumococcal Polysaccharide-23 12/01/2018   Unspecified SARS-COV-2 Vaccination 12/23/2022     Objective: Vital Signs: BP 102/63   Pulse 67   Temp (!) 96.9 F (36.1 C)   Resp 16   Ht 5' 5.5 (1.664 m)   Wt 226 lb (102.5 kg)   LMP 11/23/2011   BMI 37.04 kg/m    Physical Exam Eyes:     Conjunctiva/sclera: Conjunctivae normal.  Cardiovascular:     Rate and Rhythm: Normal rate and regular rhythm.   Pulmonary:     Effort: Pulmonary effort is normal.     Breath sounds: Normal breath sounds.  Lymphadenopathy:     Cervical: No cervical adenopathy.  Skin:    General: Skin is warm and dry.     Findings: Rash present.     Comments: Multiple rashes on scalp, around eyes, on left cheek with varying erythema and hyperpigmentation, erythematous patch on right side and more hyperpigmented  under left eye Multiple areas with erythema on periphery of lesion Make sure of erythema and hypopigmented skin peeling changes on dorsal side of multiple finger joints worse at PIPs   Neurological:     Mental Status: She is alert.  Psychiatric:        Mood and Affect: Mood normal.      Musculoskeletal Exam:  Left shoulder passive ROM is normal, tenderness, no palpable swelling Elbows full ROM no tenderness or swelling Wrists full ROM no tenderness or swelling Fingers full ROM no tenderness or swelling Low back midline and left sided tenderness just above iliac crest Knees full ROM, patellofemoral crepitus, anterior and joint line tenderness mild, no palpable effusions  Investigation: No additional findings.  Imaging: NM PET CT CARDIAC PERFUSION MULTI W/ABSOLUTE BLOODFLOW Result Date: 01/27/2024   The study is normal. The study is low risk.   LV perfusion is normal.   Rest left ventricular function is normal. Rest EF: 66%. Stress left ventricular function is normal. Stress EF: 72%. End diastolic cavity size is normal. End systolic cavity size is normal.   Myocardial blood flow was computed to be 0.39ml/g/min at rest and 2.60ml/g/min at stress. Global myocardial blood flow reserve was 2.63 and was normal.   Coronary calcium was present on the attenuation correction CT images. Moderate coronary calcifications were present. Coronary calcifications were present in the left anterior descending artery, left circumflex artery and right coronary artery distribution(s).   Electronically signed by Jerel Balding,  MD CLINICAL DATA:  This over-read does not include interpretation of cardiac or coronary anatomy or pathology. The interpretation by the cardiologist is attached. COMPARISON:  CT chest 09/08/2023. FINDINGS: Scout view of the chest is grossly unremarkable. Atherosclerotic calcification of the aorta. No acute extracardiac findings. IMPRESSION: 1. No acute extracardiac findings. 2.  Aortic atherosclerosis (ICD10-I70.0). Electronically Signed   By: Newell Eke M.D.   On: 01/27/2024 13:12   Recent Labs: Lab Results  Component Value Date   WBC 5.0 11/10/2023   HGB 12.3 11/10/2023   PLT 245 11/10/2023   NA 132 (L) 11/10/2023   K 4.6 11/10/2023   CL 96 (L) 11/10/2023   CO2 26 11/10/2023   GLUCOSE 91 11/10/2023   BUN 21 11/10/2023   CREATININE 1.14 (H) 11/10/2023   BILITOT 0.4 11/10/2023   ALKPHOS 82 12/08/2007   AST 16 11/10/2023   ALT 18 11/10/2023   PROT 7.1 11/10/2023   ALBUMIN 3.9 12/08/2007   CALCIUM 9.7 11/10/2023   GFRAA >60 07/08/2016    Speciality Comments: PLQ Eye Exam: Patient has an eye exam scheduled for 05/25/2023 @ [facility]  Call to fax results  Procedures:  No procedures performed Allergies: Penicillins and Tape   Assessment / Plan:     Visit Diagnoses: Discoid lupus - Plan: dapsone  25 MG tablet 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 Current treatment with Plaquenil  and topical steroids insufficient. Insurance issues limit access to tacrolimus  and other oral medications. Dapsone  considered as alternative. Discussed dapsone  side effects. Blood counts normal in August. No new rashes or significant symptoms since last visit. UV light and smoke potential triggers. Wigs not major exacerbating factor. - Prescribed dapsone  50 mg once daily. -Continue hydroxychloroquine  200 mg daily - Instructed to monitor blood counts one month after starting dapsone . - Agree with topical/intralesional steroids to continue as needed, can  discuss alternative topical or approving tacrolimus  with dermatology f/u  High risk medication use - hydroxychloroquine  200 mg BID - Plan:  CBC with Differential/Platelet, Comprehensive metabolic panel with GFR - Order to check CBC and CMP approximately 1 month after starting dapsone  for medication monitoring   Orders: Orders Placed This Encounter  Procedures   CBC with Differential/Platelet   Comprehensive metabolic panel with GFR   Meds ordered this encounter  Medications   dapsone  25 MG tablet    Sig: Take 2 tablets (50 mg total) by mouth daily. Start with 1 tablet daily for 1 week    Dispense:  60 tablet    Refill:  2     Follow-Up Instructions: Return in about 3 months (around 05/12/2024) for CCLE on HCQ/dapsone  start.   Lonni LELON Ester, MD  Note - This record has been created using [employer].  Chart creation errors have been sought, but may not always  have been located. Such creation errors do not reflect on  the standard of medical care.

## 2024-01-29 ENCOUNTER — Ambulatory Visit: Payer: Self-pay | Admitting: Cardiology

## 2024-02-03 ENCOUNTER — Ambulatory Visit: Admitting: Primary Care

## 2024-02-10 ENCOUNTER — Ambulatory Visit: Attending: Internal Medicine | Admitting: Internal Medicine

## 2024-02-10 ENCOUNTER — Encounter: Payer: Self-pay | Admitting: Internal Medicine

## 2024-02-10 VITALS — BP 102/63 | HR 67 | Temp 96.9°F | Resp 16 | Ht 65.5 in | Wt 226.0 lb

## 2024-02-10 DIAGNOSIS — L930 Discoid lupus erythematosus: Secondary | ICD-10-CM

## 2024-02-10 DIAGNOSIS — G5601 Carpal tunnel syndrome, right upper limb: Secondary | ICD-10-CM

## 2024-02-10 DIAGNOSIS — Z79899 Other long term (current) drug therapy: Secondary | ICD-10-CM

## 2024-02-10 DIAGNOSIS — G6289 Other specified polyneuropathies: Secondary | ICD-10-CM

## 2024-02-10 DIAGNOSIS — M170 Bilateral primary osteoarthritis of knee: Secondary | ICD-10-CM | POA: Diagnosis not present

## 2024-02-10 MED ORDER — DAPSONE 25 MG PO TABS
50.0000 mg | ORAL_TABLET | Freq: Every day | ORAL | 2 refills | Status: AC
Start: 2024-02-10 — End: ?

## 2024-02-12 ENCOUNTER — Telehealth: Payer: Self-pay

## 2024-02-12 NOTE — Telephone Encounter (Signed)
 Patient contacted the office and states she was seen in the office yesterday and prescribed Dapsone . Patient inquires if she is okay to take the Dapsone  and Plaquenil  together. Patient states she will hold the two medication until she gets a response. Please advise.

## 2024-02-26 ENCOUNTER — Other Ambulatory Visit: Payer: Self-pay | Admitting: Primary Care

## 2024-02-26 ENCOUNTER — Telehealth: Admitting: Primary Care

## 2024-02-26 DIAGNOSIS — G4733 Obstructive sleep apnea (adult) (pediatric): Secondary | ICD-10-CM | POA: Diagnosis not present

## 2024-02-26 DIAGNOSIS — F17210 Nicotine dependence, cigarettes, uncomplicated: Secondary | ICD-10-CM

## 2024-02-26 DIAGNOSIS — M329 Systemic lupus erythematosus, unspecified: Secondary | ICD-10-CM | POA: Diagnosis not present

## 2024-02-26 DIAGNOSIS — J45909 Unspecified asthma, uncomplicated: Secondary | ICD-10-CM | POA: Diagnosis not present

## 2024-02-26 NOTE — Progress Notes (Signed)
 Virtual Visit via Video Note  I connected with Courtney Payne on 02/26/24 at  3:30 PM EST by a video enabled telemedicine application and verified that I am speaking with the correct person using two identifiers.  Location: Patient: Home Provider: Office    I discussed the limitations of evaluation and management by telemedicine and the availability of in person appointments. The patient expressed understanding and agreed to proceed.  History of Present Illness: 58 year old female, current everyday smoker.  Past medical history significant for hypertension, asthma, sleep apnea, type 2 diabetes, discoid lupus.  Previous [facility] pulmonary encounter:  12/22/2022 Patient presents today for sleep consult. She was followed by pulmonary with [facility] for hx asthma and moderate OSA. She was last seen by them in July 2021.   She comes in to our clinic today for sleep consult. Last sleep study 05/2018 showed mild OSA with AHI of 13.2/hour. She reports seeing a slight improvement in sleep quality and energy level with PAP therapy. She has not worn CPAP in about 2 years, she had a hard time tolerating due to anxiety. She did better with nasal mask. She has an airsense 10 machine which is about 77-38 years old. Download not avaialble, pressure settings 5-20cm h20. She reports 20-30lbs weight loss since original sleep study. Unsure if she snores. She very occasionally with wake herself up snoring/gasping/choking. Epworth 6/24. No concern for narcolepsy, cataplexy or sleep walking.   Chronic dyspnea symptoms. She takes Garment/textile Technologist. She uses Albuterol  2-3 times a week on average. Heat/humidity will flare up her symptoms. She is on Plaquenil  two times daily, awaiting an appointment with rheumatology.   Sleep questionnaire Symptoms- unsure  Prior sleep study- 2021 with [facility] in [place]  Bedtime- 11pm-1am Time to fall asleep- 1-2 hours  Nocturnal awakenings- 1-2 times  Out of  bed/start of day- 7:30-8:30am  Weight changes- 30 lbs  Do you operate heavy machinery- no Do you currently wear CPAP- unsure  Do you current wear oxygen- no Epworth- 6    04/20/2023 Discussed the use of AI scribe software for clinical note transcription with the patient, who gave verbal consent to proceed.  History of Present Illness   The patient, with a history of asthma, sleep apnea, and a former smoker, presents for a follow-up visit. They report a history of sleep apnea, previously managed with a CPAP machine, which they stopped using approximately two years ago due to difficulty tolerating it. They have since lost 20-30 pounds and recently underwent a repeat sleep study. The repeat study in October 2024 showed mild sleep apnea with nine apneic events per hour. She spent 11 minutes with an oxygen level less than 88%. This is an improvement from a previous study in 2020, which showed 13 apneic events per hour.  The patient reports occasional snoring and has woken up gasping or choking on a few occasions. They also describe restless sleep, often moving around and even falling out of bed a couple of times. They express a desire to lose more weight, with a goal weight of 210 pounds, down from their current weight of 241 pounds.  In terms of their asthma, the patient reports occasional wheezing and shortness of breath, particularly when walking uphill. They use a rescue inhaler approximately three times a week when out and about, and express a desire for a nebulizer for use during acute episodes such as upper respiratory infections or bronchitis. They are currently on Trelegy 100mcg for their asthma.  01/20/2024 Discussed the use of AI scribe software for clinical note transcription with the patient, who gave verbal consent to proceed. History of Present Illness  Courtney Payne is a 58 year old female with moderate persistent asthma and mild obstructive sleep apnea who presents for  follow-up.  She uses Trelegy 100mcg once daily for asthma management, finding it more effective and with fewer side effects compared to Stiolto. Albuterol  is used as needed, approximately two to four times a week, especially during exertion, such as running for a bus. Over the past four weeks, asthma has caused some limitations in daily activities, with shortness of breath occurring three to six times a week. She occasionally wakes up at night due to asthma symptoms, about once or twice a month.  For her mild obstructive sleep apnea, she uses a CPAP machine with an Airfit P10 nasal pillow mask, which she finds comfortable. She received a new CPAP machine in August but has inconsistent use, often forgetting to put it on before falling asleep. When used, she sleeps well, achieving nearly eight hours of sleep.  She has a history of lupus, currently experiencing a flare-up affecting her skin, causing arthritis and headaches. She takes Plaquenil  200 mg twice daily and uses topical ointments for skin symptoms. Concerned about her kidney health, currently at stage 3A, she follows a Mediterranean diet. A CT scan in March 2025 showed nodular densities in the left upper lobe, which resolved by June, and a small pericardial effusion was noted.   02/26/2024- interim hx  Discussed the use of AI scribe software for clinical note transcription with the patient, who gave verbal consent to proceed.  History of Present Illness Courtney Payne is a 58 year old female with asthma and mild sleep apnea who presents for a follow-up visit.  She has been using her CPAP machine more consistently since November, averaging four hours and thirty-eight minutes per night. She reports that she actually sleeps well with the CPAP and has missed a few days at the end of November due to travel. Her CPAP machine is a newer model, with the older one kept as a backup. Her sleep study from October showed nine apneic events per hour, with the  lowest oxygen saturation at eighty-five percent and an average of ninety-nine percent. The CPAP pressure settings are on auto, ranging from five to twelve.  For asthma management, she uses Trelegy once daily in the mid-morning and has only needed her rescue inhaler once or twice, typically when rushing to catch the bus.  She is managing lupus with Plaquenil , taken twice daily, and reports current flares. She is under the care of both a rheumatologist and a dermatologist.  She recently saw a cardiologist and received a message that her stress test results were normal.      Observations/Objective:  Appears well without overt respiratory symptoms    Sleep testing: 01/21/23 HST >> Mild OSA, AHI 9.1/hour with SpO2 low 85% (baseline 99%). Patient spent 11 mins with SpO2 <88%   Imaging: 06/12/23 CT chest  w contrast>> subtle nodular densities left upper lobe.  Findings could correlate with subtle pneumonitis.  09/08/23 CT chest wo contrast >> resolution of previously nodules, no acute airspace disease. No suspicious pulmonary nodules. Incidental findings Small pericardial effusion. Multi-vessel coronary vascular Calcification.   Assessment and Plan:  Assessment and Plan Assessment & Plan Obstructive sleep apnea Mild obstructive sleep apnea with an apnea-hypopnea index of 9 events per hour. Oxygen saturation dropped to 85% during sleep  study but averaged 99%. CPAP usage has improved since November, with an average of 4 hours and 38 minutes per night. Compliance is at 60-70%, which is borderline for insurance requirements. She reports improved sleep quality with CPAP use. She reports benefit from therapy.  - Continue CPAP therapy with nasal pillow mask.  - Encouraged consistent CPAP use, aiming for 6-8 hours per night. - Will schedule a virtual check-in in three months or sooner if insurance compliance issues arise.  Asthma Well-controlled with Trelegy once daily. She uses a rescue inhaler  only once or twice a week, primarily when rushing to catch a bus. - Continue Trelegy once daily in the morning.  Systemic lupus erythematosus Managed with Plaquenil  twice daily. Managed by both rheumatology and dermatology. - Continue Plaquenil  twice daily.  Follow Up Instructions:  3 months with Beth NP or sooner if needed   I discussed the assessment and treatment plan with the patient. The patient was provided an opportunity to ask questions and all were answered. The patient agreed with the plan and demonstrated an understanding of the instructions.   The patient was advised to call back or seek an in-person evaluation if the symptoms worsen or if the condition fails to improve as anticipated.  I provided 22 minutes of non-face-to-face time during this encounter.   Almarie LELON Ferrari, NP

## 2024-03-01 ENCOUNTER — Other Ambulatory Visit: Payer: Self-pay | Admitting: Registered Nurse

## 2024-03-01 DIAGNOSIS — Z1231 Encounter for screening mammogram for malignant neoplasm of breast: Secondary | ICD-10-CM

## 2024-03-03 NOTE — Congregational Nurse Program (Signed)
°  Dept: [phone]   Congregational Nurse Program Note  Date of Encounter: 03/01/2024  Clinic visit to weigh and discuss skin lesions from Lupus.  Weight 218 Lbs, has lost weight since last clinic visit.  Discussed foods to eat to continue weight loss. Past Medical History: Past Medical History:  Diagnosis Date   Arthritis    Asthma    Diabetes mellitus    Dyslexia    Hypertension    Lupus    Stage 3 chronic kidney disease [facility])     Encounter Details:  Community Questionnaire - 03/01/24 1715       Questionnaire   Ask client: Do you give verbal consent for me to treat you today? Yes    Location Patient [place]    Encounter Setting CN site    Population Status Unknown   Has own apartment at [facility];Medicare    Insurance/Financial Assistance Referral N/A    Medication N/A    Medical Provider Yes    Screening Referrals Made N/A    Medical Referrals Made N/A    Medical Appointment Completed N/A    CNP Interventions Advocate/Support;Counsel;Educate    Screenings CN Performed Weight    ED Visit Averted N/A    Life-Saving Intervention Made N/A

## 2024-03-14 NOTE — Congregational Nurse Program (Signed)
" °  Dept: [phone]   Congregational Nurse Program Note  Date of Encounter: 03/08/2024  Resident visit to clinic to discuss dietary choices to prevent further kidney damage.  Also, discussed lesions on scalp from lupus. Past Medical History: Past Medical History:  Diagnosis Date   Arthritis    Asthma    Diabetes mellitus    Dyslexia    Hypertension    Lupus    Stage 3 chronic kidney disease (HCC)     Encounter Details:  Community Questionnaire - 03/08/24 1600       Questionnaire   Ask client: Do you give verbal consent for me to treat you today? Yes    Student Assistance N/A    Location Patient [place]    Encounter Setting CN site    Population Status Unknown   Has own apartment at [facility];Medicare    Insurance/Financial Assistance Referral N/A    Medication N/A    Medical Provider Yes    Screening Referrals Made N/A    Medical Referrals Made N/A    Medical Appointment Completed N/A    CNP Interventions Advocate/Support;Counsel;Educate;Case Management;Spiritual Care    Screenings CN Performed Weight    ED Visit Averted N/A    Life-Saving Intervention Made N/A            "

## 2024-04-14 ENCOUNTER — Ambulatory Visit
Admission: RE | Admit: 2024-04-14 | Discharge: 2024-04-14 | Disposition: A | Discharge status: Home / Self Care | Source: Ambulatory Visit | Attending: Registered Nurse | Admitting: Registered Nurse

## 2024-04-14 DIAGNOSIS — Z1231 Encounter for screening mammogram for malignant neoplasm of breast: Secondary | ICD-10-CM

## 2024-04-21 ENCOUNTER — Other Ambulatory Visit: Payer: Self-pay

## 2024-04-21 DIAGNOSIS — L930 Discoid lupus erythematosus: Secondary | ICD-10-CM

## 2024-04-21 DIAGNOSIS — M170 Bilateral primary osteoarthritis of knee: Secondary | ICD-10-CM

## 2024-04-21 DIAGNOSIS — G6289 Other specified polyneuropathies: Secondary | ICD-10-CM

## 2024-04-21 NOTE — Telephone Encounter (Signed)
 Last Fill: 12/03/2023  Next Visit: 05/12/2024  Last Visit: 02/10/2024  Dx: Discoid lupus   Current Dose per office note on 02/10/2024: dose not discussed   Okay to refill Gabapentin ?   Patient left voicemail stating that she asked PCP for a refill and was advised tha Dr Jeannetta needed to be the one to refill it.

## 2024-04-22 MED ORDER — GABAPENTIN 800 MG PO TABS: 800.0000 mg | ORAL_TABLET | Freq: Three times a day (TID) | ORAL | 0 refills | Status: AC

## 2024-04-28 NOTE — Progress Notes (Unsigned)
 "  Office Visit Note  Patient: Courtney Payne             Date of Birth: 02-06-1966           MRN: [id number]             PCP: [facility] Referring: [facility], [facility] Visit Date: 05/12/2024   Subjective:  No chief complaint on file.   History of Present Illness: Courtney Payne is a 59 y.o. 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   Previous HPI 02/10/2024 Courtney Payne is a 59 y.o. 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     She experiences persistent itching, particularly on her back and scalp, describing the sensation as 'itching, itching, itching'. She has been using topical steroids, specifically betamethasone on her head and another cream on her face and ears, but finds them not very effective. She has been prescribed tacrolimus , but her insurance did not cover it, so she has not been able to use it. Currently, she is only taking Plaquenil  and using topical steroids.   She has a history of discoid lupus, diagnosed in 2015, with symptoms including rashes and scarring alopecia. She reports rashes and scarring alopecia, particularly on her scalp and ears. No new rashes outside of the scalp since the last visit.   She reports dry skin, especially at the elbows, and occasional pain in her fingers during winter weather. No lymph node swelling, lumps, or bumps in her neck. She has not been sick with any new illnesses since the last visit.   She follows a Mediterranean diet and avoids processed foods.         Previous HPI 11/10/2023 Courtney Payne is a 59 y.o. 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    She experiences recurrent skin rashes characterized by itching and peeling of the hands, with changes in skin color. Topical steroids are used daily to manage these symptoms, and without medication, her skin  becomes scaly. Betamethasone is applied to her head, and oil is used for her ears, which has helped alleviate some issues.   She takes Plaquenil  (hydroxychloroquine ) daily, which has provided some relief, but symptoms persist, particularly with sweating. Excessive sweating exacerbates the itching, and she experiences cold sweats at night and frequent hot flashes, leading to excessive sweating during the day.   She reports persistent pain in her shoulder, hip, and knees, attributing it to arthritis. Additionally, she notes a new sore that was burning in the morning.   Her family history includes lupus and kidney issues. Previous blood tests and a CT scan earlier in the year were performed, and a small amount of fluid around her heart was noted.         Previous HPI 07/09/2023 Courtney Payne is a 59 year old female with osteoarthritis and discoid lupus on hydroxychloroquine  200 mg BID.   She has osteoarthritis affecting her knees, left shoulder, and back, with the right knee being more symptomatic. The pain is associated with bone spurs and wear and tear in the joints, and she has been informed of more cartilage thinning in the right knee. She manages the pain with gabapentin  for general pain relief.   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 She uses hydroxychloroquine  and topical steroid creams, including fluocinolone for her face and betamethasone  for her head. The rashes tend to recur frequently.   She has a history of carpal tunnel syndrome in her right wrist and neuropathy in her hands, experiencing numbness and pain, particularly at night. 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   She has mild degenerative disc disease in her back, with narrowing of the space between the bones and bone spurs, leading to sciatica with pain radiating down her legs.   She is concerned  about her kidney health due to a family history of kidney disease, as her sisters and mother are kidney patients. However, her kidney function is normal and there is no protein in her urine.   She mentions a [place] [address], attributing it to dietary changes while living with her grandchildren. She is motivated to lose this weight.       Previous HPI 05/15/23 Courtney Payne is a 59 year old female with discoid lupus here for evaluation management of her rashes also with joint pain in multiple areas.   She experiences significant knee pain, describing her knees as 'terrible' and expressing concern about the condition of her kneecaps. She has not had recent imaging but had x-rays of both knees in 2020 when she applied for social security disability. 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 She is not currently taking any medication specifically for pain but uses gabapentin  and medical marijuana, noting that gabapentin  is largely ineffective. She also takes duloxetine, which was recently increased to 90 mg daily, to help with sleep and pain management.   She was diagnosed with discoid lupus in 2015 by Dr. Jackquline Burrow with biopsy. She has been on hydroxychloroquine  since then. She experiences painful skin rashes on her face and back, which are sometimes painful to the touch. She uses topical ointments, as she had a severe reaction to creams in the past. The rashes are currently inflamed and painful. No rashes on her arms or legs.  Review of records there was consideration of alternate DMARD such as methotrexate or thalidomide but she never started on treatment with these and does not recall these drugs by name.   She experiences significant hair loss, which she attributes to her lupus, and is distressed over being bald, linking it to her depression. She has not been  on any other oral medications for lupus besides hydroxychloroquine .   She reports eye trouble and is scheduled for an eye exam on March 3rd. No floaters or visual field issues. She is aware of the potential side effects of hydroxychloroquine  on the eyes and has regular screenings that have been normal.   She describes pain in her shoulder, particularly after a recent fall, and notes that it is difficult to move her arm backward.  Notices increased pain in this area if she rolls onto her left side and if she tries to fall asleep lying on her back.  She has not had any imaging of her shoulder.   She reports nerve damage in her hand which causes weakness and pain, particularly in her right hand. Her fingers hurt and sometimes she cannot cut things due to pain.  She had a nerve conduction study in September 2024 indicating moderate right median nerve impingement but there was also evidence of significant peripheral neuropathy.  She has not pursued any definitive management for this after the testing.     11/2022 HAV/HBV/HCV neg   08/2020 TPMT Genotype 1*/1*   09/2019 ANA neg dsDNA, RNP, Sm, SSA, SSB neg     No Rheumatology ROS completed.   PMFS History:  Patient Active Problem List   Diagnosis Date Noted   Carpal tunnel syndrome, right upper limb 07/09/2023   Peripheral neuropathy 07/09/2023   High risk medication use 05/15/2023   Bilateral primary osteoarthritis of knee 05/15/2023   Chronic low back pain 05/15/2023   Pain in left shoulder 05/15/2023   Moderate obstructive sleep apnea 12/22/2022   Asthma 06/02/2018   Discoid lupus 03/04/2018   HTN (hypertension) 06/20/2015   Type 2 diabetes mellitus without complication 06/20/2015    Past Medical History:  Diagnosis Date   Arthritis    Asthma    Diabetes mellitus    Dyslexia    Hypertension    Lupus    Stage 3 chronic kidney disease (HCC)     Family History  Problem Relation Age of Onset   Heart failure Mother    Chronic  Renal Failure Mother    Breast cancer Maternal Aunt 35       recur @ 88   Past Surgical History:  Procedure Laterality Date   BREAST BIOPSY Right 2022   benign   FOOT SURGERY  01/27/2011   right foot for ganglion cyst   OOPHORECTOMY     Social History   Social History Narrative   Lives alone.  Two children.  Retired LAWYER.     Immunization History  Administered Date(s) Administered   Influenza, Seasonal, Injecte, Preservative Fre 12/18/2023   Influenza,inj,Quad PF,6+ Mos 12/16/2017, 12/01/2018   Influenza-Unspecified 12/23/2022   Pneumococcal Polysaccharide-23 12/01/2018   Unspecified SARS-COV-2 Vaccination 12/23/2022     Objective: Vital Signs: LMP 11/23/2011    Physical Exam   Musculoskeletal Exam: ***   Investigation: No additional findings.  Imaging: MM 3D SCREENING MAMMOGRAM BILATERAL BREAST Result Date: 04/25/2024 CLINICAL DATA:  Screening. EXAM: DIGITAL SCREENING BILATERAL MAMMOGRAM WITH TOMOSYNTHESIS AND CAD TECHNIQUE: Bilateral screening digital craniocaudal and mediolateral oblique mammograms were obtained. Bilateral screening digital breast tomosynthesis was performed. The images were evaluated with computer-aided detection. COMPARISON:  Previous exam(s). ACR Breast Density Category a: The breasts are almost entirely fatty. FINDINGS: There are no findings suspicious for malignancy. IMPRESSION: No mammographic evidence of malignancy. A result letter of this screening mammogram will be mailed directly to the patient. RECOMMENDATION: Screening mammogram in one year. (Code:SM-B-01Y) BI-RADS CATEGORY  1: Negative. Electronically Signed   By: Corean Salter M.D.   On: 04/25/2024 08:33    Recent Labs: Lab Results  Component Value Date   WBC 5.0 11/10/2023   HGB 12.3 11/10/2023   PLT 245 11/10/2023   NA 132 (L) 11/10/2023   K 4.6 11/10/2023   CL 96 (L) 11/10/2023   CO2 26 11/10/2023   GLUCOSE 91 11/10/2023   BUN 21 11/10/2023   CREATININE 1.14 (H) 11/10/2023    BILITOT 0.4 11/10/2023   ALKPHOS 82 12/08/2007   AST 16 11/10/2023   ALT 18 11/10/2023   PROT 7.1 11/10/2023   ALBUMIN 3.9 12/08/2007   CALCIUM 9.7 11/10/2023   GFRAA >60 07/08/2016    Speciality Comments: PLQ Eye Exam: Patient has an eye exam scheduled for 05/25/2023 @ [facility]  Call to fax results  Procedures:  No procedures performed Allergies: Penicillins and Tape   Assessment / Plan:  Visit Diagnoses:  Assessment & Plan Discoid lupus     High risk medication use      ***  Follow-Up Instructions: No follow-ups on file.   Jocee Kissick M Jazzman Loughmiller, CMA  Note - This record has been created using Animal nutritionist.  Chart creation errors have been sought, but may not always  have been located. Such creation errors do not reflect on  the standard of medical care. "

## 2024-04-28 NOTE — Assessment & Plan Note (Signed)
 Courtney Payne

## 2024-04-28 NOTE — Assessment & Plan Note (Signed)
 SABRA

## 2024-05-12 ENCOUNTER — Ambulatory Visit: Admitting: Internal Medicine

## 2024-05-12 DIAGNOSIS — Z79899 Other long term (current) drug therapy: Secondary | ICD-10-CM

## 2024-05-12 DIAGNOSIS — L930 Discoid lupus erythematosus: Secondary | ICD-10-CM
